# Patient Record
Sex: Female | Born: 1960
Health system: Southern US, Community
[De-identification: ages and names within clinical notes are randomized; demographics above are authoritative.]

## PROBLEM LIST (undated history)

## (undated) DIAGNOSIS — M1990 Unspecified osteoarthritis, unspecified site: Secondary | ICD-10-CM

## (undated) DIAGNOSIS — F419 Anxiety disorder, unspecified: Secondary | ICD-10-CM

## (undated) HISTORY — PX: COLON SURGERY: SHX602

## (undated) HISTORY — PX: BACK SURGERY: SHX140

## (undated) HISTORY — PX: OTHER SURGICAL HISTORY: SHX169

## (undated) HISTORY — PX: COLONOSCOPY: SHX174

## (undated) HISTORY — PX: ABDOMINAL HYSTERECTOMY: SHX81

---

## 2001-03-08 ENCOUNTER — Emergency Department (HOSPITAL_COMMUNITY): Admission: EM | Admit: 2001-03-08 | Discharge: 2001-03-08 | Payer: Self-pay | Admitting: Emergency Medicine

## 2006-09-23 ENCOUNTER — Ambulatory Visit: Payer: Self-pay | Admitting: Internal Medicine

## 2007-02-07 ENCOUNTER — Ambulatory Visit: Payer: Self-pay | Admitting: Endocrinology

## 2007-10-03 ENCOUNTER — Ambulatory Visit: Payer: Self-pay | Admitting: Internal Medicine

## 2007-10-04 ENCOUNTER — Ambulatory Visit: Payer: Self-pay | Admitting: Internal Medicine

## 2007-10-13 ENCOUNTER — Ambulatory Visit: Payer: Self-pay | Admitting: General Surgery

## 2014-03-19 ENCOUNTER — Ambulatory Visit (INDEPENDENT_AMBULATORY_CARE_PROVIDER_SITE_OTHER): Payer: BC Managed Care – PPO | Admitting: Podiatry

## 2014-03-19 ENCOUNTER — Ambulatory Visit (INDEPENDENT_AMBULATORY_CARE_PROVIDER_SITE_OTHER): Payer: BC Managed Care – PPO

## 2014-03-19 ENCOUNTER — Encounter: Payer: Self-pay | Admitting: Podiatry

## 2014-03-19 VITALS — BP 122/79 | HR 81 | Resp 16 | Ht 67.0 in | Wt 123.0 lb

## 2014-03-19 DIAGNOSIS — M79675 Pain in left toe(s): Secondary | ICD-10-CM

## 2014-03-19 DIAGNOSIS — M205X9 Other deformities of toe(s) (acquired), unspecified foot: Secondary | ICD-10-CM

## 2014-03-19 DIAGNOSIS — M205X2 Other deformities of toe(s) (acquired), left foot: Secondary | ICD-10-CM

## 2014-03-19 DIAGNOSIS — L84 Corns and callosities: Secondary | ICD-10-CM

## 2014-03-19 DIAGNOSIS — M79609 Pain in unspecified limb: Secondary | ICD-10-CM

## 2014-03-19 NOTE — Progress Notes (Signed)
   Subjective:    Patient ID: Sarah Clarke, female    DOB: 03-11-1961, 53 y.o.   MRN: [id number]  HPI Comments: 53 year old female presents the office today with complaints of a painful lesion on the inside aspect of her left fifth toe. She states the areas and painful for over one year and has been progressively getting worse. She states it is more painful with shoe gear. She states that she periodically removes the lesion herself. She also states that she went to see someone at [facility] in which they  Removed the lesion. No other complaints at this time.   Foot Pain      Review of Systems  HENT: Positive for sinus pressure.   Skin:       Change in nails  Allergic/Immunologic: Positive for environmental allergies.  All other systems reviewed and are negative.      Objective:   Physical Exam AAO x3, NAD DP/PT pulses palpable b/l. CRT < 3sec Protective sensation intact with Derrel Nip monofilament, vibratory sensation intact, Achilles tendon reflex intact. Left fifth digit medial aspect reveals hyperkeratotic lesion at approximately the head of the proximal phalanx region. Upon debridement there is no capillary bleeding and the underlying skin was intact. 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 MMT 5/5, ROM WNL No other lesions identified, no open lesions Her leg pain, swelling, warmth.      Assessment & Plan:  53 year old female with adductovarus left fifth digit with hyperkeratotic lesion due to pressure. -X-rays were obtained and reviewed with the patient. See x-ray report. Adductovarus fifth digit. No acute fracture. -Conservative versus surgical intervention were discussed with the patient in detail including alternatives, risks, complications. -Hyperkeratotic lesion sharply debrided without complications the patient comfort. -Stress the etiology and why lesion keeps  recurring. -Dispensed offloading pads to help protect the area. -Followup as needed. Call with any questions or concerns or any change in symptoms.

## 2014-06-18 ENCOUNTER — Other Ambulatory Visit: Payer: Self-pay | Admitting: Neurosurgery

## 2014-06-18 DIAGNOSIS — G959 Disease of spinal cord, unspecified: Secondary | ICD-10-CM

## 2014-06-19 ENCOUNTER — Ambulatory Visit
Admission: RE | Admit: 2014-06-19 | Discharge: 2014-06-19 | Disposition: A | Discharge status: Home / Self Care | Payer: BC Managed Care – PPO | Source: Ambulatory Visit | Attending: Neurosurgery | Admitting: Neurosurgery

## 2014-06-19 DIAGNOSIS — G959 Disease of spinal cord, unspecified: Secondary | ICD-10-CM

## 2014-12-28 ENCOUNTER — Inpatient Hospital Stay
Admission: EM | Admit: 2014-12-28 | Discharge: 2015-01-09 | DRG: 330 | Disposition: A | Discharge status: Home / Self Care | Payer: 59 | Attending: Surgery | Admitting: Surgery

## 2014-12-28 ENCOUNTER — Emergency Department: Payer: 59

## 2014-12-28 ENCOUNTER — Encounter: Payer: Self-pay | Admitting: Emergency Medicine

## 2014-12-28 DIAGNOSIS — R112 Nausea with vomiting, unspecified: Secondary | ICD-10-CM

## 2014-12-28 DIAGNOSIS — Z981 Arthrodesis status: Secondary | ICD-10-CM

## 2014-12-28 DIAGNOSIS — Z885 Allergy status to narcotic agent status: Secondary | ICD-10-CM

## 2014-12-28 DIAGNOSIS — Y836 Removal of other organ (partial) (total) as the cause of abnormal reaction of the patient, or of later complication, without mention of misadventure at the time of the procedure: Secondary | ICD-10-CM | POA: Diagnosis not present

## 2014-12-28 DIAGNOSIS — K562 Volvulus: Principal | ICD-10-CM | POA: Diagnosis present

## 2014-12-28 DIAGNOSIS — F4323 Adjustment disorder with mixed anxiety and depressed mood: Secondary | ICD-10-CM

## 2014-12-28 DIAGNOSIS — R1084 Generalized abdominal pain: Secondary | ICD-10-CM

## 2014-12-28 DIAGNOSIS — Y92239 Unspecified place in hospital as the place of occurrence of the external cause: Secondary | ICD-10-CM

## 2014-12-28 DIAGNOSIS — F419 Anxiety disorder, unspecified: Secondary | ICD-10-CM | POA: Diagnosis present

## 2014-12-28 DIAGNOSIS — K56609 Unspecified intestinal obstruction, unspecified as to partial versus complete obstruction: Secondary | ICD-10-CM

## 2014-12-28 DIAGNOSIS — Z888 Allergy status to other drugs, medicaments and biological substances status: Secondary | ICD-10-CM

## 2014-12-28 DIAGNOSIS — K913 Postprocedural intestinal obstruction: Secondary | ICD-10-CM | POA: Diagnosis not present

## 2014-12-28 DIAGNOSIS — M1990 Unspecified osteoarthritis, unspecified site: Secondary | ICD-10-CM | POA: Diagnosis present

## 2014-12-28 HISTORY — DX: Unspecified osteoarthritis, unspecified site: M19.90

## 2014-12-28 HISTORY — DX: Anxiety disorder, unspecified: F41.9

## 2014-12-28 LAB — CBC WITH DIFFERENTIAL/PLATELET
BASOS ABS: 0.1 10*3/uL (ref 0–0.1)
Basophils Relative: 1 %
Eosinophils Absolute: 0.2 10*3/uL (ref 0–0.7)
Eosinophils Relative: 2 %
HEMATOCRIT: 43.8 % (ref 35.0–47.0)
Hemoglobin: 14.8 g/dL (ref 12.0–16.0)
Lymphocytes Relative: 15 %
Lymphs Abs: 1.4 10*3/uL (ref 1.0–3.6)
MCH: 30.9 pg (ref 26.0–34.0)
MCHC: 33.8 g/dL (ref 32.0–36.0)
MCV: 91.2 fL (ref 80.0–100.0)
MONO ABS: 0.8 10*3/uL (ref 0.2–0.9)
Monocytes Relative: 8 %
NEUTROS ABS: 7 10*3/uL — AB (ref 1.4–6.5)
Neutrophils Relative %: 74 %
Platelets: 401 10*3/uL (ref 150–440)
RBC: 4.8 MIL/uL (ref 3.80–5.20)
RDW: 11.9 % (ref 11.5–14.5)
WBC: 9.4 10*3/uL (ref 3.6–11.0)

## 2014-12-28 LAB — COMPREHENSIVE METABOLIC PANEL
ALT: 14 U/L (ref 14–54)
ANION GAP: 10 (ref 5–15)
AST: 25 U/L (ref 15–41)
Albumin: 4.7 g/dL (ref 3.5–5.0)
Alkaline Phosphatase: 58 U/L (ref 38–126)
BUN: 7 mg/dL (ref 6–20)
CALCIUM: 9.6 mg/dL (ref 8.9–10.3)
CO2: 25 mmol/L (ref 22–32)
CREATININE: 0.7 mg/dL (ref 0.44–1.00)
Chloride: 93 mmol/L — ABNORMAL LOW (ref 101–111)
GFR calc Af Amer: >60 mL/min (ref >60)
GFR calc non Af Amer: >60 mL/min (ref >60)
GLUCOSE: 114 mg/dL — AB (ref 65–99)
Potassium: 3.5 mmol/L (ref 3.5–5.1)
Sodium: 128 mmol/L — ABNORMAL LOW (ref 135–145)
TOTAL PROTEIN: 8 g/dL (ref 6.5–8.1)
Total Bilirubin: 0.9 mg/dL (ref 0.3–1.2)

## 2014-12-28 LAB — ETHANOL: Alcohol, Ethyl (B): <5 mg/dL (ref <5)

## 2014-12-28 LAB — LIPASE, BLOOD: Lipase: 24 U/L (ref 22–51)

## 2014-12-28 MED ORDER — HYDROMORPHONE HCL 1 MG/ML IJ SOLN
INTRAMUSCULAR | Status: AC
  Administered 2014-12-28: 0.5 mg via INTRAVENOUS
  Filled 2014-12-28: qty 1

## 2014-12-28 MED ORDER — IOHEXOL 240 MG/ML SOLN
25.0000 mL | INTRAMUSCULAR | Status: AC
  Administered 2014-12-28: 25 mL via ORAL

## 2014-12-28 MED ORDER — ONDANSETRON HCL 4 MG/2ML IJ SOLN
4.0000 mg | Freq: Once | INTRAMUSCULAR | Status: AC
  Administered 2014-12-28: 4 mg via INTRAVENOUS

## 2014-12-28 MED ORDER — ONDANSETRON HCL 4 MG/2ML IJ SOLN
INTRAMUSCULAR | Status: AC
  Administered 2014-12-28: 4 mg via INTRAVENOUS
  Filled 2014-12-28: qty 2

## 2014-12-28 MED ORDER — HYDROMORPHONE HCL 1 MG/ML IJ SOLN
0.5000 mg | Freq: Once | INTRAMUSCULAR | Status: AC
  Administered 2014-12-28: 0.5 mg via INTRAVENOUS

## 2014-12-28 MED ORDER — SODIUM CHLORIDE 0.9 % IV SOLN
1000.0000 mL | Freq: Once | INTRAVENOUS | Status: AC
  Administered 2014-12-28: 1000 mL via INTRAVENOUS

## 2014-12-28 MED ORDER — FENTANYL CITRATE (PF) 100 MCG/2ML IJ SOLN
50.0000 ug | Freq: Once | INTRAMUSCULAR | Status: AC
  Administered 2014-12-28: 50 ug via INTRAVENOUS

## 2014-12-28 MED ORDER — SODIUM CHLORIDE 0.9 % IV BOLUS (SEPSIS)
1000.0000 mL | Freq: Once | INTRAVENOUS | Status: AC
  Administered 2014-12-28: 1000 mL via INTRAVENOUS

## 2014-12-28 MED ORDER — FENTANYL CITRATE (PF) 100 MCG/2ML IJ SOLN
INTRAMUSCULAR | Status: AC
  Administered 2014-12-28: 50 ug via INTRAVENOUS
  Filled 2014-12-28: qty 2

## 2014-12-28 NOTE — ED Notes (Signed)
Pt. Presents to the ED in wheelchair in pain of 10/10 to rt. Lower abdominal area radiating to the back.  Pt. States it pain started around 11am today.  Pt. States no hx of kidney stones.

## 2014-12-28 NOTE — ED Provider Notes (Signed)
[facility] Emergency Department Provider Note  ____________________________________________  Time seen: Approximately 11:11 PM  I have reviewed the triage vital signs and the nursing notes.   HISTORY  Chief Complaint Flank Pain    HPI Sarah Clarke is a 54 y.o. female who presents with diffuse abdominal pain, onset approximately 11 AM. Patient describes 10/10 lower abdominal pain radiating to bilateral flanks, right greater than left and now is experiencing pain diffusely. Patient has never experienced pain like this before. Patient describes alternating sharp and dull pain associated with several episodes of vomiting. Patient last had a normal bowel movement 2 yesterday. Nothing makes the pain better or worse.Patient denies fever, chills, chest pain, shortness of breath, diarrhea, dysuria, hematuria, headache, weakness, numbness, tingling. Patient denies prior history of colitis, diverticulitis, ovarian cysts or endometriosis.   Past Medical History  Diagnosis Date  . Arthritis   . Anxiety     There are no active problems to display for this patient.   Past Surgical History  Procedure Laterality Date  . Neck fusion    . Abdominal hysterectomy      Partial  Partial hysterectomy  Current Outpatient Rx  Name  Route  Sig  Dispense  Refill  . ALPRAZolam (XANAX) 0.25 MG tablet   Oral   Take 0.25 mg by mouth at bedtime as needed for anxiety.           Allergies Codeine and Morphine and related  Family history Kidney stones   Social History History  Substance Use Topics  . Smoking status: Never Smoker   . Smokeless tobacco: Not on file  . Alcohol Use: 14.4 oz/week    24 Cans of beer per week    Review of Systems Constitutional: No fever/chills Eyes: No visual changes. ENT: No sore throat. Cardiovascular: Denies chest pain. Respiratory: Denies shortness of breath. Gastrointestinal: Positive for abdominal pain.  Positive for nausea  and vomiting.  No diarrhea.  No constipation. Genitourinary: Negative for dysuria. Musculoskeletal: As above for flank pain. Skin: Negative for rash. Neurological: Negative for headaches, focal weakness or numbness.  10-point ROS otherwise negative.  ____________________________________________   PHYSICAL EXAM:  VITAL SIGNS: ED Triage Vitals  Enc Vitals Group     BP 12/28/14 2110 139/102 mmHg     Pulse Rate 12/28/14 2110 82     Resp --      Temp 12/28/14 2110 97.7 F (36.5 C)     Temp Source 12/28/14 2110 Oral     SpO2 12/28/14 2110 98 %     Weight 12/28/14 2110 118 lb (53.524 kg)     Height 12/28/14 2110 5\' 7"  (1.702 m)     Head Cir --      Peak Flow --      Pain Score 12/28/14 2111 10     Pain Loc --      Pain Edu? --      Excl. in [place]? --     Constitutional: Alert and oriented. Well appearing and in moderate acute distress. Eyes: Conjunctivae are normal. PERRL. EOMI. Head: Atraumatic. Nose: No congestion/rhinnorhea. Mouth/Throat: Mucous membranes are moist.  Oropharynx non-erythematous. Neck: No stridor.   Cardiovascular: Normal rate, regular rhythm. Grossly normal heart sounds.  Good peripheral circulation. Respiratory: Normal respiratory effort.  No retractions. Lungs CTAB. Gastrointestinal: Soft, diffusely tender with voluntary guarding, no rebound. No distention. No abdominal bruits. Mild right CVA tenderness. Musculoskeletal: No lower extremity tenderness nor edema.  No joint effusions. Neurologic:  Normal speech  and language. No gross focal neurologic deficits are appreciated. Speech is normal. No gait instability. Skin:  Skin is warm, dry and intact. No rash noted. Psychiatric: Mood and affect are normal. Speech and behavior are normal.  ____________________________________________   LABS (all labs ordered are listed, but only abnormal results are displayed)  Labs Reviewed  CBC WITH DIFFERENTIAL/PLATELET - Abnormal; Notable for the following:    Neutro  Abs 7.0 (*)    All other components within normal limits  COMPREHENSIVE METABOLIC PANEL - Abnormal; Notable for the following:    Sodium 128 (*)    Chloride 93 (*)    Glucose, Bld 114 (*)    All other components within normal limits  URINALYSIS COMPLETEWITH MICROSCOPIC ([facility] ONLY) - Abnormal; Notable for the following:    Color, Urine YELLOW (*)    APPearance HAZY (*)    Ketones, ur 1+ (*)    Squamous Epithelial / LPF 0-5 (*)    All other components within normal limits  ETHANOL  LIPASE, BLOOD   ____________________________________________  EKG  None ____________________________________________  RADIOLOGY  CT abdomen/pelvis discussed with Dr. Radene Knee: 1. Twisting of the mesentery of the ascending colon, with rotation of the ascending colon approximately 630 degrees around the mesentery, compatible with cecal volvulus. There appears to be effacement of the underlying vasculature. The cecum is flipped anteriorly, and dilated to 11.1 cm in diameter with air, and a small amount of fluid and solid material. 2. The more distal colon is decompressed. Scattered diverticulosis along the mid sigmoid colon, without evidence of diverticulitis. 3. Trace ascites about the liver and within the pelvis may reflect the cecal volvulus. 4. Minimal nonspecific prominence of the intrahepatic biliary ducts, likely within normal limits. ____________________________________________   PROCEDURES  Procedure(s) performed: None  Critical Care performed: No  ____________________________________________   INITIAL IMPRESSION / ASSESSMENT AND PLAN / ED COURSE  Pertinent labs & imaging results that were available during my care of the patient were reviewed by me and considered in my medical decision making (see chart for details).  54 year old female who presents with diffuse abdominal pain and vomiting. Labs noted for hyponatremia. Plan for IV fluid resuscitation, IV analgesia and antiemetic; CT  abdomen/pelvis to evaluate etiology of patient's pain.  ----------------------------------------- 1:05 AM on 12/29/2014 -----------------------------------------  Patient vomited first bottle of contrast. Antiemetic given. Patient working on additional PO contrast.   ----------------------------------------- 1:46 AM on 12/29/2014 -----------------------------------------  Patient vomited again. Will proceed to CT. IV Valium ordered for what patient describes as "muscle spasms".  ----------------------------------------- 2:52 AM on 12/29/2014 -----------------------------------------  Spoke with radiologist Dr. Radene Knee regarding CT results concerning for cecal volvulus. I have discussed the case with Dr. Rexene Edison from general surgery who will evaluate the patient in the emergency department. Patient's pain has increased and she is writhing around the bed in pain, restless and unable to remain still. Will give additional IV analgesia.  ____________________________________________   FINAL CLINICAL IMPRESSION(S) / ED DIAGNOSES  Final diagnoses:  Generalized abdominal pain  Non-intractable vomiting with nausea, vomiting of unspecified type  Cecal volvulus      Paulette Blanch, MD 12/29/14 [phone]

## 2014-12-29 ENCOUNTER — Emergency Department: Payer: 59 | Admitting: Certified Registered Nurse Anesthetist

## 2014-12-29 ENCOUNTER — Inpatient Hospital Stay: Admit: 2014-12-29 | Payer: Self-pay | Admitting: Surgery

## 2014-12-29 ENCOUNTER — Encounter: Payer: Self-pay | Admitting: Emergency Medicine

## 2014-12-29 ENCOUNTER — Encounter
Admission: EM | Disposition: A | Discharge status: Home / Self Care | Payer: Self-pay | Source: Home / Self Care | Attending: Surgery

## 2014-12-29 ENCOUNTER — Emergency Department: Payer: 59

## 2014-12-29 DIAGNOSIS — R1084 Generalized abdominal pain: Secondary | ICD-10-CM | POA: Diagnosis present

## 2014-12-29 DIAGNOSIS — Y92239 Unspecified place in hospital as the place of occurrence of the external cause: Secondary | ICD-10-CM | POA: Diagnosis not present

## 2014-12-29 DIAGNOSIS — M1990 Unspecified osteoarthritis, unspecified site: Secondary | ICD-10-CM | POA: Diagnosis present

## 2014-12-29 DIAGNOSIS — F4323 Adjustment disorder with mixed anxiety and depressed mood: Secondary | ICD-10-CM | POA: Diagnosis not present

## 2014-12-29 DIAGNOSIS — Z981 Arthrodesis status: Secondary | ICD-10-CM | POA: Diagnosis not present

## 2014-12-29 DIAGNOSIS — Y836 Removal of other organ (partial) (total) as the cause of abnormal reaction of the patient, or of later complication, without mention of misadventure at the time of the procedure: Secondary | ICD-10-CM | POA: Diagnosis not present

## 2014-12-29 DIAGNOSIS — K562 Volvulus: Secondary | ICD-10-CM | POA: Diagnosis present

## 2014-12-29 DIAGNOSIS — Z888 Allergy status to other drugs, medicaments and biological substances status: Secondary | ICD-10-CM | POA: Diagnosis not present

## 2014-12-29 DIAGNOSIS — K913 Postprocedural intestinal obstruction: Secondary | ICD-10-CM | POA: Diagnosis not present

## 2014-12-29 DIAGNOSIS — F419 Anxiety disorder, unspecified: Secondary | ICD-10-CM | POA: Diagnosis present

## 2014-12-29 DIAGNOSIS — Z885 Allergy status to narcotic agent status: Secondary | ICD-10-CM | POA: Diagnosis not present

## 2014-12-29 HISTORY — PX: LAPAROTOMY: SHX154

## 2014-12-29 LAB — URINALYSIS COMPLETE WITH MICROSCOPIC (ARMC ONLY)
Bacteria, UA: NONE SEEN
Bilirubin Urine: NEGATIVE
Glucose, UA: NEGATIVE mg/dL
Hgb urine dipstick: NEGATIVE
Leukocytes, UA: NEGATIVE
Nitrite: NEGATIVE
Protein, ur: NEGATIVE mg/dL
Specific Gravity, Urine: 1.009 (ref 1.005–1.030)
pH: 8 (ref 5.0–8.0)

## 2014-12-29 LAB — GLUCOSE, CAPILLARY: GLUCOSE-CAPILLARY: 104 mg/dL — AB (ref 65–99)

## 2014-12-29 SURGERY — LAPAROTOMY, EXPLORATORY
Anesthesia: General | Wound class: Contaminated

## 2014-12-29 MED ORDER — ONDANSETRON HCL 4 MG/2ML IJ SOLN
4.0000 mg | Freq: Four times a day (QID) | INTRAMUSCULAR | Status: DC | PRN
  Administered 2015-01-02: 4 mg via INTRAVENOUS
  Filled 2014-12-29: qty 2

## 2014-12-29 MED ORDER — DIPHENHYDRAMINE HCL 12.5 MG/5ML PO ELIX
12.5000 mg | ORAL_SOLUTION | Freq: Four times a day (QID) | ORAL | Status: DC | PRN
  Filled 2014-12-29: qty 5

## 2014-12-29 MED ORDER — DEXAMETHASONE SODIUM PHOSPHATE 4 MG/ML IJ SOLN
INTRAMUSCULAR | Status: DC | PRN
  Administered 2014-12-29: 5 mg via INTRAVENOUS

## 2014-12-29 MED ORDER — DIAZEPAM 5 MG/ML IJ SOLN
INTRAMUSCULAR | Status: AC
  Administered 2014-12-29: 1 mg via INTRAVENOUS
  Filled 2014-12-29: qty 2

## 2014-12-29 MED ORDER — ACETAMINOPHEN 325 MG PO TABS: 650.0000 mg | ORAL_TABLET | Freq: Four times a day (QID) | ORAL | Status: DC | PRN

## 2014-12-29 MED ORDER — PROMETHAZINE HCL 25 MG/ML IJ SOLN: 12.5000 mg | INTRAMUSCULAR | Status: DC | PRN

## 2014-12-29 MED ORDER — FENTANYL CITRATE (PF) 100 MCG/2ML IJ SOLN
INTRAMUSCULAR | Status: AC
  Filled 2014-12-29: qty 2

## 2014-12-29 MED ORDER — PROMETHAZINE HCL 25 MG/ML IJ SOLN
12.5000 mg | Freq: Once | INTRAMUSCULAR | Status: AC
  Administered 2014-12-29: 12.5 mg via INTRAVENOUS

## 2014-12-29 MED ORDER — DIAZEPAM 5 MG/ML IJ SOLN
1.0000 mg | Freq: Once | INTRAMUSCULAR | Status: AC
  Administered 2014-12-29: 1 mg via INTRAVENOUS

## 2014-12-29 MED ORDER — HYDROMORPHONE HCL 1 MG/ML IJ SOLN
INTRAMUSCULAR | Status: AC
  Filled 2014-12-29: qty 1

## 2014-12-29 MED ORDER — SODIUM CHLORIDE 0.9 % IV BOLUS (SEPSIS)
1000.0000 mL | Freq: Once | INTRAVENOUS | Status: AC
  Administered 2014-12-29: 1000 mL via INTRAVENOUS

## 2014-12-29 MED ORDER — ACETAMINOPHEN 650 MG RE SUPP
650.0000 mg | Freq: Four times a day (QID) | RECTAL | Status: DC | PRN
  Filled 2014-12-29: qty 1

## 2014-12-29 MED ORDER — LIDOCAINE HCL (CARDIAC) 20 MG/ML IV SOLN
INTRAVENOUS | Status: DC | PRN
  Administered 2014-12-29: 50 mg via INTRAVENOUS

## 2014-12-29 MED ORDER — MIDAZOLAM HCL 5 MG/5ML IJ SOLN
INTRAMUSCULAR | Status: AC
  Filled 2014-12-29: qty 5

## 2014-12-29 MED ORDER — HYDROMORPHONE 0.3 MG/ML IV SOLN
INTRAVENOUS | Status: DC
  Administered 2014-12-29: 2.1 mg via INTRAVENOUS
  Administered 2014-12-29: 0.9 mg via INTRAVENOUS
  Administered 2014-12-29: 5 mg via INTRAVENOUS
  Administered 2014-12-29: 0.3 mg via INTRAVENOUS
  Administered 2014-12-30: 0.9 mg via INTRAVENOUS
  Administered 2014-12-30: 20:00:00 via INTRAVENOUS
  Administered 2014-12-31: 0.599 mg via INTRAVENOUS
  Administered 2014-12-31: 1.19 mg via INTRAVENOUS
  Administered 2014-12-31 (×2): 2.67 mg via INTRAVENOUS
  Administered 2014-12-31: 0.2 mg via INTRAVENOUS
  Administered 2015-01-01: 0.447 mL via INTRAVENOUS
  Administered 2015-01-01: 0.39 mg via INTRAVENOUS
  Administered 2015-01-01: 2.19 mL via INTRAVENOUS
  Administered 2015-01-01: 0.999 mg via INTRAVENOUS
  Administered 2015-01-01: 0 mg via INTRAVENOUS
  Administered 2015-01-01: 0.999 mg via INTRAVENOUS
  Administered 2015-01-01: 2.19 mL via INTRAVENOUS
  Administered 2015-01-02: 0.59 mg via INTRAVENOUS
  Administered 2015-01-02: 16:00:00 via INTRAVENOUS
  Administered 2015-01-02: 2.39 mg via INTRAVENOUS
  Administered 2015-01-03: 0.599 mg via INTRAVENOUS
  Administered 2015-01-03: 1.19 mg via INTRAVENOUS
  Administered 2015-01-03: 0.599 mg via INTRAVENOUS
  Administered 2015-01-03: 21:00:00 via INTRAVENOUS
  Administered 2015-01-03: 0.799 mg via INTRAVENOUS
  Administered 2015-01-03: 0.599 mg via INTRAVENOUS
  Administered 2015-01-03: 0.4 mg via INTRAVENOUS
  Administered 2015-01-04: 1.59 mg via INTRAVENOUS
  Administered 2015-01-04: 0 mg via INTRAVENOUS
  Administered 2015-01-04: 0.4 mg via INTRAVENOUS
  Administered 2015-01-04: 4 mg via INTRAVENOUS
  Administered 2015-01-05: 1.19 mg via INTRAVENOUS
  Administered 2015-01-05: 0 mg via INTRAVENOUS
  Administered 2015-01-05: 03:00:00 via INTRAVENOUS
  Filled 2014-12-29 (×7): qty 25

## 2014-12-29 MED ORDER — HYDROMORPHONE HCL 1 MG/ML IJ SOLN
INTRAMUSCULAR | Status: AC
  Administered 2014-12-29: 0.5 mg via INTRAVENOUS
  Filled 2014-12-29: qty 1

## 2014-12-29 MED ORDER — IOHEXOL 300 MG/ML  SOLN
100.0000 mL | Freq: Once | INTRAMUSCULAR | Status: AC | PRN
  Administered 2014-12-29: 100 mL via INTRAVENOUS

## 2014-12-29 MED ORDER — ALPRAZOLAM 0.25 MG PO TABS
0.2500 mg | ORAL_TABLET | Freq: Every evening | ORAL | Status: DC | PRN
  Administered 2014-12-31 – 2015-01-01 (×2): 0.25 mg via ORAL
  Filled 2014-12-29 (×2): qty 1

## 2014-12-29 MED ORDER — HEPARIN SODIUM (PORCINE) 5000 UNIT/ML IJ SOLN
5000.0000 [IU] | Freq: Three times a day (TID) | INTRAMUSCULAR | Status: DC
  Administered 2014-12-29 – 2015-01-09 (×32): 5000 [IU] via SUBCUTANEOUS
  Filled 2014-12-29 (×31): qty 1

## 2014-12-29 MED ORDER — SODIUM CHLORIDE 0.9 % IV SOLN
1.0000 g | Freq: Once | INTRAVENOUS | Status: AC
  Administered 2014-12-29: 1 g via INTRAVENOUS
  Filled 2014-12-29: qty 1

## 2014-12-29 MED ORDER — DIAZEPAM 5 MG/ML IJ SOLN: 1.0000 mg | Freq: Once | INTRAMUSCULAR | Status: DC

## 2014-12-29 MED ORDER — LACTATED RINGERS IV SOLN
INTRAVENOUS | Status: DC
  Administered 2014-12-29: 1000 mL via INTRAVENOUS
  Administered 2014-12-29 – 2015-01-05 (×9): via INTRAVENOUS

## 2014-12-29 MED ORDER — PROMETHAZINE HCL 25 MG/ML IJ SOLN
INTRAMUSCULAR | Status: AC
  Administered 2014-12-29: 12.5 mg via INTRAVENOUS
  Filled 2014-12-29: qty 1

## 2014-12-29 MED ORDER — ROCURONIUM BROMIDE 100 MG/10ML IV SOLN
INTRAVENOUS | Status: DC | PRN
  Administered 2014-12-29: 30 mg via INTRAVENOUS

## 2014-12-29 MED ORDER — FENTANYL CITRATE (PF) 100 MCG/2ML IJ SOLN
25.0000 ug | INTRAMUSCULAR | Status: AC | PRN
  Administered 2014-12-29 (×6): 25 ug via INTRAVENOUS

## 2014-12-29 MED ORDER — FENTANYL CITRATE (PF) 100 MCG/2ML IJ SOLN
INTRAMUSCULAR | Status: DC | PRN
  Administered 2014-12-29: 50 ug via INTRAVENOUS
  Administered 2014-12-29: 100 ug via INTRAVENOUS

## 2014-12-29 MED ORDER — HYDROMORPHONE HCL 1 MG/ML IJ SOLN
0.5000 mg | Freq: Once | INTRAMUSCULAR | Status: AC
  Administered 2014-12-29: 0.5 mg via INTRAVENOUS

## 2014-12-29 MED ORDER — NALOXONE HCL 0.4 MG/ML IJ SOLN
0.4000 mg | INTRAMUSCULAR | Status: DC | PRN
Start: 2014-12-29 — End: 2015-01-05
  Filled 2014-12-29: qty 1

## 2014-12-29 MED ORDER — SODIUM CHLORIDE 0.9 % IJ SOLN: 9.0000 mL | INTRAMUSCULAR | Status: DC | PRN

## 2014-12-29 MED ORDER — LACTATED RINGERS IV SOLN
INTRAVENOUS | Status: DC | PRN
  Administered 2014-12-29 (×2): via INTRAVENOUS

## 2014-12-29 MED ORDER — PANTOPRAZOLE SODIUM 40 MG IV SOLR
40.0000 mg | Freq: Every day | INTRAVENOUS | Status: DC
  Administered 2014-12-29 – 2015-01-04 (×7): 40 mg via INTRAVENOUS
  Filled 2014-12-29 (×8): qty 40

## 2014-12-29 MED ORDER — MIDAZOLAM HCL 2 MG/2ML IJ SOLN
2.0000 mg | Freq: Once | INTRAMUSCULAR | Status: AC
  Administered 2014-12-29: 2 mg via INTRAVENOUS

## 2014-12-29 MED ORDER — MIDAZOLAM HCL 2 MG/2ML IJ SOLN
INTRAMUSCULAR | Status: DC | PRN
  Administered 2014-12-29: 2 mg via INTRAVENOUS

## 2014-12-29 MED ORDER — SUCCINYLCHOLINE CHLORIDE 20 MG/ML IJ SOLN
INTRAMUSCULAR | Status: DC | PRN
  Administered 2014-12-29: 100 mg via INTRAVENOUS

## 2014-12-29 MED ORDER — 0.9 % SODIUM CHLORIDE (POUR BTL) OPTIME
TOPICAL | Status: DC | PRN
  Administered 2014-12-29: 1200 mL

## 2014-12-29 MED ORDER — ONDANSETRON HCL 4 MG/2ML IJ SOLN: 4.0000 mg | Freq: Once | INTRAMUSCULAR | Status: DC | PRN

## 2014-12-29 MED ORDER — ONDANSETRON HCL 4 MG/2ML IJ SOLN
4.0000 mg | Freq: Four times a day (QID) | INTRAMUSCULAR | Status: DC | PRN
  Administered 2015-01-02 (×2): 4 mg via INTRAVENOUS
  Filled 2014-12-29 (×2): qty 2

## 2014-12-29 MED ORDER — DIPHENHYDRAMINE HCL 50 MG/ML IJ SOLN: 12.5000 mg | Freq: Four times a day (QID) | INTRAMUSCULAR | Status: DC | PRN

## 2014-12-29 MED ORDER — ONDANSETRON HCL 4 MG/2ML IJ SOLN
INTRAMUSCULAR | Status: DC | PRN
  Administered 2014-12-29: 4 mg via INTRAVENOUS

## 2014-12-29 SURGICAL SUPPLY — 29 items
CANISTER SUCT 1200ML W/VALVE (MISCELLANEOUS) ×2 IMPLANT
CATH TRAY 16F METER LATEX (MISCELLANEOUS) ×2 IMPLANT
CHLORAPREP W/TINT 26ML (MISCELLANEOUS) ×2 IMPLANT
DRAPE LAPAROTOMY 100X77 ABD (DRAPES) ×2 IMPLANT
DRSG OPSITE POSTOP 4X10 (GAUZE/BANDAGES/DRESSINGS) ×2 IMPLANT
ELECT CAUTERY BLADE 6.4 (BLADE) ×2 IMPLANT
GAUZE SPONGE 4X4 12PLY STRL (GAUZE/BANDAGES/DRESSINGS) IMPLANT
GLOVE BIO SURGEON STRL SZ7.5 (GLOVE) ×4 IMPLANT
GLOVE INDICATOR 8.0 STRL GRN (GLOVE) ×4 IMPLANT
GOWN STRL REUS W/ TWL LRG LVL3 (GOWN DISPOSABLE) ×2 IMPLANT
GOWN STRL REUS W/TWL LRG LVL3 (GOWN DISPOSABLE) ×4
KIT RM TURNOVER STRD PROC AR (KITS) ×2 IMPLANT
LABEL OR SOLS (LABEL) ×2 IMPLANT
NS IRRIG 1000ML POUR BTL (IV SOLUTION) ×4 IMPLANT
PACK BASIN MAJOR ARMC (MISCELLANEOUS) ×2 IMPLANT
PAD GROUND ADULT SPLIT (MISCELLANEOUS) ×2 IMPLANT
RELOAD PROXIMATE 75MM BLUE (ENDOMECHANICALS) ×4 IMPLANT
SHEARS HARMONIC STRL 23CM (MISCELLANEOUS) ×2 IMPLANT
STAPLER GUN LINEAR PROX 60 (STAPLE) ×2 IMPLANT
STAPLER PROXIMATE 75MM BLUE (STAPLE) ×2 IMPLANT
STAPLER SKIN PROX 35W (STAPLE) ×2 IMPLANT
SUT PDS AB 1 TP1 96 (SUTURE) ×4 IMPLANT
SUT SILK 3 0 (SUTURE) ×2
SUT SILK 3-0 (SUTURE) ×2
SUT SILK 3-0 18XBRD TIE 12 (SUTURE) ×1 IMPLANT
SUT SILK 3-0 SH-1 18XCR BRD (SUTURE) ×1
SUT VIC AB 3-0 SH 27 (SUTURE) ×2
SUT VIC AB 3-0 SH 27X BRD (SUTURE) ×1 IMPLANT
SUTURE SILK 3-0 SH-1 18XCR BRD (SUTURE) ×1 IMPLANT

## 2014-12-29 NOTE — Anesthesia Postprocedure Evaluation (Addendum)
  Anesthesia Post-op Note  Patient: Sarah Clarke  Procedure(s) Performed: Procedure(s): EXPLORATORY LAPAROTOMY with right hemicolectomy  (N/A)  Anesthesia type:General  Patient location: PACU  Post pain: Pain level controlled  Post assessment: Post-op Vital signs reviewed, Patient's Cardiovascular Status Stable, Respiratory Function Stable, Patent Airway and No signs of Nausea or vomiting  Post vital signs: Reviewed and stable  Last Vitals:  Filed Vitals:   12/29/14 0737  BP: 135/81  Pulse: 85  Temp: 36.5 C  Resp: 17    Level of consciousness: awake, alert  and patient cooperative  Complications: No apparent anesthesia complications Pt with no anesthesia complaints.  Surgeons ordering PCA.  Pt c/o abd discomfort

## 2014-12-29 NOTE — Progress Notes (Signed)
Day of Surgery   Subjective:  Slept well. Pain control. Speech minimally slurred.  Vital signs in last 24 hours: Temp:  [96.9 F (36.1 C)-97.8 F (36.6 C)] 97.7 F (36.5 C) (06/12 0737) Pulse Rate:  [59-105] 85 (06/12 0737) Resp:  [8-26] 15 (06/12 0751) BP: (109-183)/(69-106) 135/81 mmHg (06/12 0737) SpO2:  [79 %-100 %] 100 % (06/12 0751) FiO2 (%):  [100 %] 100 % (06/12 0751) Weight:  [53.524 kg (118 lb)] 53.524 kg (118 lb) (06/11 2110)    Intake/Output from previous day: 06/11 0701 - 06/12 0700 In: 1400 [I.V.:1400] Out: 975 [Urine:925; Blood:50]  GI: soft, non-tender; bowel sounds normal; no masses,  no organomegaly  Lab Results:  CBC  Recent Labs  12/28/14 2150  WBC 9.4  HGB 14.8  HCT 43.8  PLT 401   CMP     Component Value Date/Time   NA 128* 12/28/2014 2150   K 3.5 12/28/2014 2150   CL 93* 12/28/2014 2150   CO2 25 12/28/2014 2150   GLUCOSE 114* 12/28/2014 2150   BUN 7 12/28/2014 2150   CREATININE 0.70 12/28/2014 2150   CALCIUM 9.6 12/28/2014 2150   PROT 8.0 12/28/2014 2150   ALBUMIN 4.7 12/28/2014 2150   AST 25 12/28/2014 2150   ALT 14 12/28/2014 2150   ALKPHOS 58 12/28/2014 2150   BILITOT 0.9 12/28/2014 2150   GFRNONAA >60 12/28/2014 2150   GFRAA >60 12/28/2014 2150   PT/INR No results for input(s): LABPROT, INR in the last 72 hours.  Studies/Results: Ct Abdomen Pelvis W Contrast  12/29/2014   CLINICAL DATA:  Acute onset of right-sided abdominal and lower back pain, nausea and vomiting. Initial encounter.  EXAM: CT ABDOMEN AND PELVIS WITH CONTRAST  TECHNIQUE: Multidetector CT imaging of the abdomen and pelvis was performed using the standard protocol following bolus administration of intravenous contrast.  CONTRAST:  113mL OMNIPAQUE IOHEXOL 300 MG/ML  SOLN  COMPARISON:  Lumbar spine radiographs performed 05/29/2014  FINDINGS: The visualized lung bases are clear.  Trace ascites is noted about the liver. There is minimal prominence of the intrahepatic  biliary ducts. The gallbladder is unremarkable in appearance. The common bile duct remains grossly normal in caliber.  The liver and spleen are otherwise unremarkable in appearance. The gallbladder is within normal limits. The pancreas and adrenal glands are unremarkable.  Mild bilateral renal pelvicaliectasis remains within normal limits. There is no evidence of hydronephrosis. No renal or ureteral stones are seen. No significant perinephric stranding is appreciated.  The small bowel is unremarkable in appearance. The stomach is within normal limits. No acute vascular abnormalities are seen.  There appears to be twisting of the mesentery at the ascending colon, with rotation of the ascending colon approximately 630 degrees around the mesentery, compatible with cecal volvulus. There appears to be effacement of the underlying vasculature. The cecum is flipped anteriorly, and dilated to 11.1 cm in diameter. The cecum is largely filled with air, and a small amount of fluid and solid material.  The more distal colon is decompressed and somewhat redundant. Scattered diverticulosis is noted along the mid sigmoid colon, without evidence of diverticulitis.  The appendix is not well characterized; there is no evidence of appendicitis.  The bladder is moderately distended and grossly unremarkable. The patient is status post hysterectomy. The ovaries are relatively symmetric. Trace fluid within the pelvis may reflect the cecal volvulus. No inguinal lymphadenopathy is seen.  No acute osseous abnormalities are identified. Endplate sclerotic change is noted at the inferior endplate  of L3.  IMPRESSION: 1. Twisting of the mesentery of the ascending colon, with rotation of the ascending colon approximately 630 degrees around the mesentery, compatible with cecal volvulus. There appears to be effacement of the underlying vasculature. The cecum is flipped anteriorly, and dilated to 11.1 cm in diameter with air, and a small amount of  fluid and solid material. 2. The more distal colon is decompressed. Scattered diverticulosis along the mid sigmoid colon, without evidence of diverticulitis. 3. Trace ascites about the liver and within the pelvis may reflect the cecal volvulus. 4. Minimal nonspecific prominence of the intrahepatic biliary ducts, likely within normal limits.  These results were called by telephone at the time of interpretation on 12/29/2014 at 2:50 am to Dr. Lurline Hare, who verbally acknowledged these results.   Electronically Signed   By: Garald Balding M.D.   On: 12/29/2014 02:52   Dg Chest Port 1 View  12/29/2014   CLINICAL DATA:  Acute onset of lower abdominal pain, radiating to the back, with nausea. Initial encounter.  EXAM: PORTABLE CHEST - 1 VIEW  COMPARISON:  None.  FINDINGS: The lungs are well-aerated and clear. There is no evidence of focal opacification, pleural effusion or pneumothorax.  The cardiomediastinal silhouette is within normal limits. No acute osseous abnormalities are seen. Cervical spinal fusion hardware is noted.  IMPRESSION: No acute cardiopulmonary process seen.   Electronically Signed   By: Garald Balding M.D.   On: 12/29/2014 00:28    Assessment/Plan: Expected course thus far. I explained the pathology and the operation to the patient and her husband. No changes to current management.

## 2014-12-29 NOTE — Transfer of Care (Signed)
Immediate Anesthesia Transfer of Care Note  Patient: Sarah Clarke  Procedure(s) Performed: Procedure(s): EXPLORATORY LAPAROTOMY with right hemicolectomy  (N/A)  Patient Location: PACU  Anesthesia Type:General  Level of Consciousness: Alert, Awake, Oriented  Airway & Oxygen Therapy: Patient Spontanous Breathing  Post-op Assessment: Report given to RN  Post vital signs: Reviewed and stable  Last Vitals:  Filed Vitals:   12/29/14 0534  BP: 119/106  Pulse: 105  Temp: 36.1 C  Resp: 17    Complications: No apparent anesthesia complications

## 2014-12-29 NOTE — Anesthesia Preprocedure Evaluation (Signed)
Anesthesia Evaluation  Patient identified by MRN, date of birth, ID band Patient awake    Reviewed: Allergy & Precautions, NPO status , Patient's Chart, lab work & pertinent test results  Airway Mallampati: III  TM Distance: >3 FB     Dental  (+) Chipped   Pulmonary          Cardiovascular     Neuro/Psych Anxiety    GI/Hepatic (+)     substance abuse  alcohol use,   Endo/Other    Renal/GU      Musculoskeletal  (+) Arthritis -,   Abdominal   Peds  Hematology   Anesthesia Other Findings She has had a cervical fusion ROM of neck slightly reduced.  Reproductive/Obstetrics                             Anesthesia Physical Anesthesia Plan  ASA: II  Anesthesia Plan: General   Post-op Pain Management:    Induction: Intravenous  Airway Management Planned: Oral ETT  Additional Equipment:   Intra-op Plan:   Post-operative Plan:   Informed Consent: I have reviewed the patients History and Physical, chart, labs and discussed the procedure including the risks, benefits and alternatives for the proposed anesthesia with the patient or authorized representative who has indicated his/her understanding and acceptance.     Plan Discussed with: CRNA  Anesthesia Plan Comments:         Anesthesia Quick Evaluation

## 2014-12-29 NOTE — Care Management Note (Signed)
Case Management Note  Patient Details  Name: Sarah Clarke MRN: [id number] Date of Birth: 1960/09/08  Subjective/Objective:    Today is date of surgery. Case Management will follow for discharge planning.                   Expected Discharge Date:                  Expected Discharge Plan:     In-House Referral:     Discharge planning Services     Post Acute Care Choice:    Choice offered to:     DME Arranged:    DME Agency:     HH Arranged:    [place] Agency:     Status of Service:     Medicare Important Message Given:    Date Medicare IM Given:    Medicare IM give by:    Date Additional Medicare IM Given:    Additional Medicare Important Message give by:     If discussed at [place] of Stay Meetings, dates discussed:    Additional Comments:  Clora Ohmer A, RN 12/29/2014, 9:12 AM

## 2014-12-29 NOTE — ED Notes (Signed)
Patient transported to CT 

## 2014-12-29 NOTE — H&P (Signed)
CC: Abdominal pain, nausea/vomiting x 1 day  HPI: 54 yo F who presents with 1 day of worsening now severe pain, nausea and vomiting. Began acutely, worsening.  Diffuse.  Writhing in pain.  No fevers/chills, night sweats, shortness of breath, cough, chest pain, diarrhea/constipation, dysuria/hematuria.  Active Ambulatory Problems    Diagnosis Date Noted  . No Active Ambulatory Problems   Resolved Ambulatory Problems    Diagnosis Date Noted  . No Resolved Ambulatory Problems   Past Medical History  Diagnosis Date  . Arthritis   . Anxiety    History reviewed. No pertinent family history.   History   Social History  . Marital Status: Divorced    Spouse Name: N/A  . Number of Children: N/A  . Years of Education: N/A   Occupational History  . Not on file.   Social History Main Topics  . Smoking status: Never Smoker   . Smokeless tobacco: Not on file  . Alcohol Use: 14.4 oz/week    24 Cans of beer per week  . Drug Use: Not on file  . Sexual Activity: Not Currently   Other Topics Concern  . Not on file   Social History Narrative   Allergies  Allergen Reactions  . Codeine Itching  . Morphine And Related Other (See Comments)    hallucinations   Meds: Xanax  ROS: Full ROS obtained, pertinent positives and negatives as above  Blood pressure 172/94, pulse 63, temperature 97.7 F (36.5 C), temperature source Oral, height 5\' 7"  (1.702 m), weight 53.524 kg (118 lb), SpO2 100 %. GEN: In acute distress/A&Ox3 FACE: no obvious facial trauma, normal external nose, normal external ears EYES: no scleral icterus, no conjunctivitis HEAD: normocephalic atraumatic CV: RRR, no MRG RESP: moving air well, lungs clear ABD: soft, extremely tender, nondistended EXT: moving all ext well, strength 5/5 NEURO: cnII-XII grossly intact, sensation intact all 4 ext  Labs: personally reviewed, pertinent abnormalities include  Na 128, Cl 93 WBC 9.4  CT: personally reviewed, significantly  dilated cecum with mesenteric swirling (radiology read is cecal volvulus 630degrees rotated)  A/P54 yo F with cecal volvulus.  Plan on Ex lap with right hemicolectomy.  Patient explained procedure, risks and benefits and she agrees to proceed.

## 2014-12-29 NOTE — Anesthesia Procedure Notes (Signed)
Procedure Name: Intubation Date/Time: 12/29/2014 4:09 AM Performed by: Eliberto Ivory Pre-anesthesia Checklist: Patient identified, Patient being monitored, Timeout performed, Emergency Drugs available and Suction available Patient Re-evaluated:Patient Re-evaluated prior to inductionOxygen Delivery Method: Circle system utilized Preoxygenation: Pre-oxygenation with 100% oxygen Intubation Type: IV induction Ventilation: Mask ventilation without difficulty Laryngoscope Size: Mac and 3 Grade View: Grade I Tube type: Oral Tube size: 7.0 mm Number of attempts: 1 Placement Confirmation: ETT inserted through vocal cords under direct vision,  positive ETCO2 and breath sounds checked- equal and bilateral Secured at: 21 cm Tube secured with: Tape Dental Injury: Teeth and Oropharynx as per pre-operative assessment

## 2014-12-29 NOTE — Op Note (Signed)
Preoperative diagnosis: Cecal Volvulus Postoperative diagnosis: Cecal Volvulus Procedure performed: Exploratory laparotomy, right hemicolectomy  Surgeon: Braya Habermehl  Anesthesia: General Endotracheal EBL: 50 ml Specimen: Right colon  Indication for surgery: Sarah Clarke is a pleasant 54 yo F who presented with a cecal volvulus.  She was brought to the OR for surgical management of cecal volvulus  Details of procedure: Informed consent was obtained.  Sarah Clarke was brought to the operating room suite.  He was induced, ETT placed, general anesthesia administered.    Abdomen prepped and draped in the standard surgical fashion and a timeout was performed correctly identifying patient name, operative site and procedure to be performed.  A midline incision was made and deepened to the fascia.  The fascia was incised and the peritoneum was entered.  Immediately the distended and dusky right colon was encountered.  It was significantly volvulized.  The colon was then untwisted and the hepatic flexure was taken down.  The colon was transected with GIA-75 staplers at the terminal ileum and just distal to the volvulus to the right of the middle colic vessels.  The colonic mesentery was divided using a combination of harmonic scalpel and suture ligature.  A stapled side-to-side functional end-to-end anastamosis was then performed.  Two stay sutures were placed to place the TI and transverse colon side by side then a colostomy and ileostomy were created at the staple lines.  A single fire of a GIA-75 was used to make a common channel.  A TX-60 was used to close the coloenterostomy.  A 3-0 silk crotch stitch was placed and the mesenteric defect was closed with a running 3-0 vicryl suture.  The bowel was then returned to the abdomen after I ensured that it was not twisted.  The abdomen was then irrigated with warm normal saline.  The incision was then closed from both ends with two running looped #1 PDS sutures which were  then tied in the middle.  The skin was then closed with staples and a sterile dressing was placed.  The patient was then awoken and brought to the post anesthesia care unit.  There were no immediate complications.  The needle, sponge and instrument count was correct at the end of the procedure.

## 2014-12-30 ENCOUNTER — Encounter: Payer: Self-pay | Admitting: Surgery

## 2014-12-30 LAB — BASIC METABOLIC PANEL
Anion gap: 6 (ref 5–15)
BUN: 10 mg/dL (ref 6–20)
CALCIUM: 8 mg/dL — AB (ref 8.9–10.3)
CO2: 28 mmol/L (ref 22–32)
Chloride: 101 mmol/L (ref 101–111)
Creatinine, Ser: 0.72 mg/dL (ref 0.44–1.00)
GLUCOSE: 94 mg/dL (ref 65–99)
Potassium: 3.7 mmol/L (ref 3.5–5.1)
Sodium: 135 mmol/L (ref 135–145)

## 2014-12-30 LAB — CBC
HEMATOCRIT: 29.4 % — AB (ref 35.0–47.0)
Hemoglobin: 9.9 g/dL — ABNORMAL LOW (ref 12.0–16.0)
MCH: 31.8 pg (ref 26.0–34.0)
MCHC: 33.9 g/dL (ref 32.0–36.0)
MCV: 93.8 fL (ref 80.0–100.0)
Platelets: 285 10*3/uL (ref 150–440)
RBC: 3.14 MIL/uL — ABNORMAL LOW (ref 3.80–5.20)
RDW: 11.8 % (ref 11.5–14.5)
WBC: 11.6 10*3/uL — ABNORMAL HIGH (ref 3.6–11.0)

## 2014-12-30 MED ORDER — DIAZEPAM 5 MG/ML IJ SOLN
2.5000 mg | INTRAMUSCULAR | Status: DC | PRN
  Administered 2014-12-31 – 2015-01-01 (×2): 2.5 mg via INTRAVENOUS
  Filled 2014-12-30 (×2): qty 2

## 2014-12-30 MED ORDER — DIPHENHYDRAMINE HCL 50 MG/ML IJ SOLN: 25.0000 mg | Freq: Every evening | INTRAMUSCULAR | Status: DC | PRN

## 2014-12-30 NOTE — Care Management Note (Signed)
Case Management Note  Patient Details  Name: Sarah Clarke MRN: [id number] Date of Birth: 08/05/1960  Subjective/Objective:        54yo Ms Sarah Clarke received a surgical repair of a cecal volvulus on 12/29/14. Today she has advanced to ice chips. Still has foley catheter. Anticipate home with home health. Case Management will follow for discharge planning.       Action/Plan:   Expected Discharge Date:                  Expected Discharge Plan:     In-House Referral:     Discharge planning Services     Post Acute Care Choice:    Choice offered to:     DME Arranged:    DME Agency:     HH Arranged:    [place] Agency:     Status of Service:     Medicare Important Message Given:    Date Medicare IM Given:    Medicare IM give by:    Date Additional Medicare IM Given:    Additional Medicare Important Message give by:     If discussed at [place] of Stay Meetings, dates discussed:    Additional Comments:  Derricka Mertz A, RN 12/30/2014, 3:32 PM

## 2014-12-30 NOTE — Progress Notes (Signed)
Surgery progress note  S: No acute issues, still uncomfortable O: AF/VSS, good uop GEN: NAD/A&Ox3 ABD: soft, approp tender, nondistended, dressing c/d/i  Hgb 9.9  A/P 54 yo s/p RHC for volvulus, doing well - foley for now - CBC in am - ice chips - valium for cramps, benadryl for sleep

## 2014-12-31 LAB — CBC
HCT: 23.5 % — ABNORMAL LOW (ref 35.0–47.0)
HEMOGLOBIN: 7.9 g/dL — AB (ref 12.0–16.0)
MCH: 31.3 pg (ref 26.0–34.0)
MCHC: 33.7 g/dL (ref 32.0–36.0)
MCV: 92.9 fL (ref 80.0–100.0)
PLATELETS: 242 10*3/uL (ref 150–440)
RBC: 2.53 MIL/uL — ABNORMAL LOW (ref 3.80–5.20)
RDW: 11.7 % (ref 11.5–14.5)
WBC: 9.6 10*3/uL (ref 3.6–11.0)

## 2014-12-31 LAB — SURGICAL PATHOLOGY

## 2014-12-31 NOTE — Progress Notes (Signed)
2 Days Post-Op  Subjective: Patient with pain. She had a dark bloody bowel movement earlier today of minimal volume. Patient denies dizziness.  Objective: Vital signs in last 24 hours: Temp:  [97.6 F (36.4 C)-100.4 F (38 C)] 97.6 F (36.4 C) (06/14 1525) Pulse Rate:  [97-105] 97 (06/14 1525) Resp:  [10-22] 17 (06/14 1200) BP: (113-150)/(74-99) 150/87 mmHg (06/14 1525) SpO2:  [93 %-100 %] 100 % (06/14 1525) FiO2 (%):  [98 %] 98 % (06/13 2016)    Intake/Output from previous day: 06/13 0701 - 06/14 0700 In: 1755 [I.V.:1755] Out: 1025 [Urine:1025] Intake/Output this shift: Total I/O In: 736.1 [I.V.:736.1] Out: 500 [Urine:500]  Physical exam:  Patient appears slightly uncomfortable. Abdomen is soft and nontender wound is clean no nondistended. Nontender calves  Lab Results: CBC   Recent Labs  12/30/14 0430 12/30/14 2356  WBC 11.6* 9.6  HGB 9.9* 7.9*  HCT 29.4* 23.5*  PLT 285 242   BMET  Recent Labs  12/28/14 2150 12/30/14 0430  NA 128* 135  K 3.5 3.7  CL 93* 101  CO2 25 28  GLUCOSE 114* 94  BUN 7 10  CREATININE 0.70 0.72  CALCIUM 9.6 8.0*   PT/INR No results for input(s): LABPROT, INR in the last 72 hours. ABG No results for input(s): PHART, HCO3 in the last 72 hours.  Invalid input(s): PCO2, PO2  Studies/Results: No results found.  Anti-infectives: Anti-infectives    Start     Dose/Rate Route Frequency Ordered Stop   12/29/14 0315  ertapenem (INVANZ) 1 g in sodium chloride 0.9 % 50 mL IVPB     1 g 100 mL/hr over 30 Minutes Intravenous  Once 12/29/14 0311 12/29/14 0412      Assessment/Plan: s/p Procedure(s): EXPLORATORY LAPAROTOMY with right hemicolectomy    H&H reviewed and there has been a gradual drop over the last few days. But the patient remains asymptomatic with R rate of 99 and a normal blood pressure in fact slightly hypertensive. I will type and screen the patient but I will wait on ordering any sort of transfusion as she is not  symptomatic. I do not think that she had a significant bloody bowel movement and that is likely old blood from her prior anastomosis. I discussed possible transfusion with her but will hold off at this point. We'll saline lock and start clear liquids.Florene Glen, MD, FACS  12/31/2014

## 2015-01-01 ENCOUNTER — Inpatient Hospital Stay: Payer: 59

## 2015-01-01 LAB — CBC WITH DIFFERENTIAL/PLATELET
Basophils Absolute: 0 10*3/uL (ref 0–0.1)
Basophils Relative: 0 %
EOS PCT: 4 %
Eosinophils Absolute: 0.3 10*3/uL (ref 0–0.7)
HEMATOCRIT: 27.1 % — AB (ref 35.0–47.0)
HEMOGLOBIN: 9.3 g/dL — AB (ref 12.0–16.0)
Lymphocytes Relative: 7 %
Lymphs Abs: 0.6 10*3/uL — ABNORMAL LOW (ref 1.0–3.6)
MCH: 31.8 pg (ref 26.0–34.0)
MCHC: 34.2 g/dL (ref 32.0–36.0)
MCV: 93.2 fL (ref 80.0–100.0)
MONO ABS: 1 10*3/uL — AB (ref 0.2–0.9)
Monocytes Relative: 12 %
Neutro Abs: 6.5 10*3/uL (ref 1.4–6.5)
Neutrophils Relative %: 77 %
Platelets: 334 10*3/uL (ref 150–440)
RBC: 2.91 MIL/uL — ABNORMAL LOW (ref 3.80–5.20)
RDW: 11.8 % (ref 11.5–14.5)
WBC: 8.5 10*3/uL (ref 3.6–11.0)

## 2015-01-01 LAB — TYPE AND SCREEN
ABO/RH(D): O POS
ANTIBODY SCREEN: NEGATIVE

## 2015-01-01 LAB — ABO/RH: ABO/RH(D): O POS

## 2015-01-01 MED ORDER — DIAZEPAM 5 MG PO TABS
5.0000 mg | ORAL_TABLET | Freq: Four times a day (QID) | ORAL | Status: DC | PRN
  Administered 2015-01-01 – 2015-01-03 (×7): 5 mg via ORAL
  Filled 2015-01-01 (×8): qty 1

## 2015-01-01 NOTE — Progress Notes (Signed)
3 Days Post-Op  Subjective: Patient status post cecal volvulus right hemicolectomy. She complains of back spasms. This was made better by IV Valium but seemed to sedate her a fair amount. No nausea or vomiting tolerating clears.  Objective: Vital signs in last 24 hours: Temp:  [97.6 F (36.4 C)-98.4 F (36.9 C)] 98.4 F (36.9 C) (06/15 0030) Pulse Rate:  [97-105] 103 (06/15 0030) Resp:  [14-24] 24 (06/15 0430) BP: (125-150)/(78-99) 129/78 mmHg (06/15 0030) SpO2:  [97 %-100 %] 99 % (06/15 0430)    Intake/Output from previous day: 06/14 0701 - 06/15 0700 In: 1562.1 [P.O.:120; I.V.:1442.1] Out: 1200 [Urine:1200] Intake/Output this shift:    Physical exam:  Distended and tympanitic abdomen with minimal tenderness and no peritoneal signs. Wound is clean and dry. Calves are nontender.  Lab Results: CBC   Recent Labs  12/30/14 2356 01/01/15 0449  WBC 9.6 8.5  HGB 7.9* 9.3*  HCT 23.5* 27.1*  PLT 242 334   BMET  Recent Labs  12/30/14 0430  NA 135  K 3.7  CL 101  CO2 28  GLUCOSE 94  BUN 10  CREATININE 0.72  CALCIUM 8.0*   PT/INR No results for input(s): LABPROT, INR in the last 72 hours. ABG No results for input(s): PHART, HCO3 in the last 72 hours.  Invalid input(s): PCO2, PO2  Studies/Results: No results found.  Anti-infectives: Anti-infectives    Start     Dose/Rate Route Frequency Ordered Stop   12/29/14 0315  ertapenem (INVANZ) 1 g in sodium chloride 0.9 % 50 mL IVPB     1 g 100 mL/hr over 30 Minutes Intravenous  Once 12/29/14 0311 12/29/14 0412      Assessment/Plan: s/p Procedure(s): EXPLORATORY LAPAROTOMY with right hemicolectomy    H&H has stabilized and actually improved since decreasing her IV fluids. She has no symptoms of anemia at this point. I would not recommend transfusion.  I will DC the IV Valium and switch to by mouth Valium as it may be more effective and last longer. I would also like to obtain a KUB today to look at her  abdominal distention and ensure that the patient is progressing in a positive manner.Florene Glen, MD, FACS  01/01/2015

## 2015-01-02 MED ORDER — BISACODYL 10 MG RE SUPP
10.0000 mg | Freq: Once | RECTAL | Status: AC
  Administered 2015-01-02: 10 mg via RECTAL
  Filled 2015-01-02: qty 1

## 2015-01-02 NOTE — Progress Notes (Signed)
4 Days Post-Op  Subjective: Patient is status post right hemicolectomy for volvulus. Her pain is improved however she had a single emesis early this morning. No passage of gas. She is seen this morning sitting on the commode thinking that she is going to pass gas.  Objective: Vital signs in last 24 hours: Temp:  [98.1 F (36.7 C)] 98.1 F (36.7 C) 01/27/2023 2347) Pulse Rate:  [102-111] 111 2023/01/27 2347) Resp:  [12-25] 24 (06/16 0732) BP: (137-141)/(89) 137/89 mmHg Jan 27, 2023 2347) SpO2:  [27 %-100 %] 98 % (06/16 0547) FiO2 (%):  [99 %] 99 % 2023-01-27 0832) Last BM Date: 12/31/14  Intake/Output from previous day: January 27, 2023 0701 - 06/16 0700 In: 1180 [I.V.:1180] Out: 550 [Urine:550] Intake/Output this shift: Total I/O In: 105 [I.V.:105] Out: -   Physical exam:  Complete exam not performed due to the patient sitting on the commode. She is in no acute distress. Wound not examined.  Lab Results: CBC   Recent Labs  12/30/14 2356 2015/01/27 0449  WBC 9.6 8.5  HGB 7.9* 9.3*  HCT 23.5* 27.1*  PLT 242 334   BMET No results for input(s): NA, K, CL, CO2, GLUCOSE, BUN, CREATININE, CALCIUM in the last 72 hours. PT/INR No results for input(s): LABPROT, INR in the last 72 hours. ABG No results for input(s): PHART, HCO3 in the last 72 hours.  Invalid input(s): PCO2, PO2  Studies/Results: Dg Abd 2 Views  27-Jan-2015   CLINICAL DATA:  Small bowel obstruction.  EXAM: ABDOMEN - 2 VIEW  COMPARISON:  CT scan of December 29, 2014.  FINDINGS: Midline surgical staples are noted. Mildly dilated small bowel loops are noted most consistent with postoperative ileus. No significant colonic dilatation is noted. No abnormal calcifications are noted. No definite free air is noted.  IMPRESSION: Mildly dilated small bowel loops are noted most consistent with postoperative ileus. No colonic dilatation is noted. Follow-up radiographs are recommended.   Electronically Signed   By: Marijo Conception, M.D.   On: 2015-01-27 09:04     Anti-infectives: Anti-infectives    Start     Dose/Rate Route Frequency Ordered Stop   12/29/14 0315  ertapenem (INVANZ) 1 g in sodium chloride 0.9 % 50 mL IVPB     1 g 100 mL/hr over 30 Minutes Intravenous  Once 12/29/14 0311 12/29/14 0412      Assessment/Plan: s/p Procedure(s): EXPLORATORY LAPAROTOMY with right hemicolectomy    Satisfactory progress as expected after right colon resection. Single emesis. We will see later today and hopefully she passed some gas and feels better.Florene Glen, MD, FACS  01/02/2015

## 2015-01-02 NOTE — Progress Notes (Signed)
Initial Nutrition Assessment  DOCUMENTATION CODES:     INTERVENTION:   (Nutrition Supplement Therapy:) Pt not interested in boost breeze at this time reports all items on clear liquid tray are too sweet Meals and snacks: Cater to pt prefences, will add sugar free jello and unsweet tea.  NUTRITION DIAGNOSIS:  Inadequate oral intake related to altered GI function as evidenced by other (see comment) (NPO/CL day 5 of admission).    GOAL:  Patient will meet greater than or equal to 90% of their needs    MONITOR:   (Energy intake, digestive system)  REASON FOR ASSESSMENT:  NPO/Clear Liquid Diet    ASSESSMENT:  Pt s/p right hemicolectomy for volvulus.  Noted single emesis this am, no flatus or BM  Past Medical History  Diagnosis Date  . Arthritis   . Anxiety      Current Nutrition: taking sips of water and unsweet tea   Food/Nutrition-Related History: Pt reports normal good appetite prior to admission   Labs:  Electrolyte and Renal Profile:  Recent Labs Lab 12/28/14 2150 12/30/14 0430  BUN 7 10  CREATININE 0.70 0.72  NA 128* 135  K 3.5 3.7      Medications: dulcolax, LR at 67ml/hr    Physical Findings: Nutrition-Focused physical exam completed. Findings are WDL for fat depletion, muscle depletion, and edema.     Weight Change: stable weight prior to admission   Height:  Ht Readings from Last 1 Encounters:  12/28/14 5\' 7"  (1.702 m)    Weight:  Wt Readings from Last 1 Encounters:  12/28/14 118 lb (53.524 kg)       BMI:  Body mass index is 18.48 kg/(m^2).  Estimated Nutritional Needs:  Kcal:  BEE 1167 kcals (IF 1.1-1.2, aF 1.3) 9295-7473 kcals/d.   Protein:  (1.2-1.5 g/d) 67-65-81 g/d  Fluid:  (25-42ml/kg) 1350-1658ml/d    Diet Order:  Diet clear liquid Room service appropriate?: Yes; Fluid consistency:: Thin  EDUCATION NEEDS:  No education needs identified at this time   Intake/Output Summary (Last 24 hours) at  01/02/15 1525 Last data filed at 01/02/15 1218  Gross per 24 hour  Intake 1534.3 ml  Output    200 ml  Net 1334.3 ml     MODERATE Care Level Breonna Gafford B. Zenia Resides, [place], [place] (pager)

## 2015-01-03 DIAGNOSIS — F4323 Adjustment disorder with mixed anxiety and depressed mood: Secondary | ICD-10-CM

## 2015-01-03 LAB — CBC WITH DIFFERENTIAL/PLATELET
Basophils Absolute: 0 10*3/uL (ref 0–0.1)
Basophils Relative: 1 %
Eosinophils Absolute: 0.6 10*3/uL (ref 0–0.7)
Eosinophils Relative: 9 %
HEMATOCRIT: 28.2 % — AB (ref 35.0–47.0)
Hemoglobin: 9.3 g/dL — ABNORMAL LOW (ref 12.0–16.0)
Lymphocytes Relative: 9 %
Lymphs Abs: 0.6 10*3/uL — ABNORMAL LOW (ref 1.0–3.6)
MCH: 30.4 pg (ref 26.0–34.0)
MCHC: 33 g/dL (ref 32.0–36.0)
MCV: 91.9 fL (ref 80.0–100.0)
MONO ABS: 1.2 10*3/uL — AB (ref 0.2–0.9)
Monocytes Relative: 18 %
Neutro Abs: 4.1 10*3/uL (ref 1.4–6.5)
Neutrophils Relative %: 63 %
Platelets: 455 10*3/uL — ABNORMAL HIGH (ref 150–440)
RBC: 3.07 MIL/uL — ABNORMAL LOW (ref 3.80–5.20)
RDW: 11.7 % (ref 11.5–14.5)
WBC: 6.4 10*3/uL (ref 3.6–11.0)

## 2015-01-03 LAB — BASIC METABOLIC PANEL
Anion gap: 8 (ref 5–15)
BUN: 9 mg/dL (ref 6–20)
CALCIUM: 7.9 mg/dL — AB (ref 8.9–10.3)
CO2: 27 mmol/L (ref 22–32)
Chloride: 98 mmol/L — ABNORMAL LOW (ref 101–111)
Creatinine, Ser: 0.53 mg/dL (ref 0.44–1.00)
GFR calc Af Amer: >60 mL/min (ref >60)
GFR calc non Af Amer: >60 mL/min (ref >60)
GLUCOSE: 104 mg/dL — AB (ref 65–99)
Potassium: 3.6 mmol/L (ref 3.5–5.1)
Sodium: 133 mmol/L — ABNORMAL LOW (ref 135–145)

## 2015-01-03 MED ORDER — BOOST / RESOURCE BREEZE PO LIQD
1.0000 | Freq: Three times a day (TID) | ORAL | Status: DC
  Administered 2015-01-03 – 2015-01-06 (×6): 1 via ORAL

## 2015-01-03 MED ORDER — TEMAZEPAM 15 MG PO CAPS
15.0000 mg | ORAL_CAPSULE | Freq: Every day | ORAL | Status: DC
  Administered 2015-01-03 – 2015-01-08 (×6): 15 mg via ORAL
  Filled 2015-01-03 (×6): qty 1

## 2015-01-03 MED ORDER — BISACODYL 10 MG RE SUPP
10.0000 mg | Freq: Once | RECTAL | Status: AC
  Administered 2015-01-03: 10 mg via RECTAL
  Filled 2015-01-03: qty 1

## 2015-01-03 MED ORDER — BOOST / RESOURCE BREEZE PO LIQD: 1.0000 | Freq: Three times a day (TID) | ORAL | Status: DC

## 2015-01-03 NOTE — Progress Notes (Signed)
5 Days Post-Op  Subjective: Feels a little better today but not walking enough. Has not passed much gas but feels rumbling. No further nausea or vomiting.  Objective: Vital signs in last 24 hours: Temp:  [98.4 F (36.9 C)-98.6 F (37 C)] 98.4 F (36.9 C) (06/17 0008) Pulse Rate:  [103-113] 103 (06/17 0008) Resp:  [11-19] 15 (06/17 0800) BP: (143-145)/(85-97) 145/85 mmHg (06/17 0008) SpO2:  [92 %-100 %] 98 % (06/17 0800) Last BM Date: 12/31/14  Intake/Output from previous day: 06/16 0701 - 06/17 0700 In: 1218.9 [P.O.:180; I.V.:1038.9] Out: 1450 [Urine:1450] Intake/Output this shift: Total I/O In: 168.2 [I.V.:168.2] Out: 0   Physical exam:  Abdomen is soft nontender fairly distended and tympanitic however, wound is clean no erythema or drainage. Calves are nontender  Lab Results: CBC   Recent Labs  10-Jan-2015 0449 01/03/15 0433  WBC 8.5 6.4  HGB 9.3* 9.3*  HCT 27.1* 28.2*  PLT 334 455*   BMET  Recent Labs  01/03/15 0433  NA 133*  K 3.6  CL 98*  CO2 27  GLUCOSE 104*  BUN 9  CREATININE 0.53  CALCIUM 7.9*   PT/INR No results for input(s): LABPROT, INR in the last 72 hours. ABG No results for input(s): PHART, HCO3 in the last 72 hours.  Invalid input(s): PCO2, PO2  Studies/Results: Dg Abd 2 Views  01/10/2015   CLINICAL DATA:  Small bowel obstruction.  EXAM: ABDOMEN - 2 VIEW  COMPARISON:  CT scan of December 29, 2014.  FINDINGS: Midline surgical staples are noted. Mildly dilated small bowel loops are noted most consistent with postoperative ileus. No significant colonic dilatation is noted. No abnormal calcifications are noted. No definite free air is noted.  IMPRESSION: Mildly dilated small bowel loops are noted most consistent with postoperative ileus. No colonic dilatation is noted. Follow-up radiographs are recommended.   Electronically Signed   By: Marijo Conception, M.D.   On: Jan 10, 2015 09:04    Anti-infectives: Anti-infectives    Start     Dose/Rate  Route Frequency Ordered Stop   12/29/14 0315  ertapenem (INVANZ) 1 g in sodium chloride 0.9 % 50 mL IVPB     1 g 100 mL/hr over 30 Minutes Intravenous  Once 12/29/14 0311 12/29/14 0412      Assessment/Plan: s/p Procedure(s): EXPLORATORY LAPAROTOMY with right hemicolectomy    Patient doing fairly well she has not passed gas yet and is distended but her vital signs and her labs are normal we'll repeat a Dulcolax suppository today to try to stimulate bowel function.Florene Glen, MD, FACS  01/03/2015

## 2015-01-03 NOTE — Consult Note (Addendum)
[facility] Face-to-Face Psychiatry Consult   Reason for Consult:  Consult for this 54 year old woman currently in the hospital for urgent bowel surgery. Concern over tearfulness and expressed hopelessness Referring Physician:  Burt Knack Patient Identification: MORNA FLUD MRN:  [id number] Principal Diagnosis: Adjustment disorder with mixed anxiety and depression Diagnosis:   Patient Active Problem List   Diagnosis Date Noted  . 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 female patient admitted with "it just seems like one thing after another". Patient is here for bowel surgery. For the last couple days she has appeared to be very sad and appears to be feeling hopeless.Marland Kitchen  HPI:  Information from the patient and the chart. Patient states that she has just been feeling very sad and hopeless and overwhelmed for the last couple days. Initially she told me it had only been for the last 2 days but later admitted that it probably been at least as long as she is been in the hospital. She feels frustrated by a variety of things. She is not able to sleep soundly at night. She doesn't like people coming to visit her because she doesn't like the way she currently looks, she is frustrated at the sick role in general. She is having frequent crying spells. Nevertheless she is totally denying suicidal ideation. Denies any psychotic symptoms. She is able to cite multiple positive things in her life and things she is looking forward to. The patient denies that she had been drinking excessively or using an excessive amount of medicine prior to admission.  Past psychiatric history: Patient has never seen a psychiatrist in the past. No history of being prescribed antidepressives. She was given diazepam at a low dose of Xanax by primary care doctors recently. No history of suicide attempts no history of inpatient psychiatric hospitalization.  Social  history: Patient has recently been living with her new boyfriend. She is not married and has no children of her own. Sounds like she tries to be very attentive to members of her extended family. She works as a Special educational needs teacher. Father died a few years ago and she is still mourning it.  Medical history: She is currently in the hospital for a cecal volvulus that needed emergent surgery. She is recovering from what I assume is major abdominal surgery. Not too long ago she needed neck surgery for a call fusion. She feels like it is just been one thing after another with her health recently.  Family history: No known family history of mental illness  Substance abuse history: The patient says that her drinking is only a normal amount. Denies that she's ever felt like it's a problem. She reports that she had been given Xanax and Valium as an outpatient but had never taken more than 1 a day at a low dose. I met her boyfriend out in the hallway after the interview and he told me that she drank too much and that he was concerned about how many pills she was taking.  Current medication in the hospital Valium when necessary for anxiety the law did on a PCA pump  HPI Elements:   Quality:  Sadness and tearfulness. Severity:  Moderate. No suicidal ideation. Timing:  Just for the last few days. Duration:  Not apparently part of an ongoing issue as far she is concerned. Context:  Stress because of recent illness and having to be in a sick  role.  Past Medical History:  Past Medical History  Diagnosis Date  . Arthritis   . Anxiety     Past Surgical History  Procedure Laterality Date  . Neck fusion    . Abdominal hysterectomy      Partial  . Laparotomy N/A 12/29/2014    Procedure: EXPLORATORY LAPAROTOMY with right hemicolectomy ;  Surgeon: Marlyce Huge, MD;  Location: [facility] ORS;  Service: General;  Laterality: N/A;   Family History: History reviewed. No pertinent family history. Social  History:  History  Alcohol Use  . 14.4 oz/week  . 24 Cans of beer per week     History  Drug Use Not on file    History   Social History  . Marital Status: Divorced    Spouse Name: N/A  . Number of Children: N/A  . Years of Education: N/A   Social History Main Topics  . Smoking status: Never Smoker   . Smokeless tobacco: Not on file  . Alcohol Use: 14.4 oz/week    24 Cans of beer per week  . Drug Use: Not on file  . Sexual Activity: Not Currently   Other Topics Concern  . None   Social History Narrative   Additional Social History:                          Allergies:   Allergies  Allergen Reactions  . Codeine Itching  . Morphine And Related Other (See Comments)    hallucinations    Labs:  Results for orders placed or performed during the hospital encounter of 12/28/14 (from the past 48 hour(s))  CBC with Differential/Platelet     Status: Abnormal   Collection Time: 01/03/15  4:33 AM  Result Value Ref Range   WBC 6.4 3.6 - 11.0 K/uL   RBC 3.07 (L) 3.80 - 5.20 MIL/uL   Hemoglobin 9.3 (L) 12.0 - 16.0 g/dL   HCT 28.2 (L) 35.0 - 47.0 %   MCV 91.9 80.0 - 100.0 fL   MCH 30.4 26.0 - 34.0 pg   MCHC 33.0 32.0 - 36.0 g/dL   RDW 11.7 11.5 - 14.5 %   Platelets 455 (H) 150 - 440 K/uL   Neutrophils Relative % 63 %   Neutro Abs 4.1 1.4 - 6.5 K/uL   Lymphocytes Relative 9 %   Lymphs Abs 0.6 (L) 1.0 - 3.6 K/uL   Monocytes Relative 18 %   Monocytes Absolute 1.2 (H) 0.2 - 0.9 K/uL   Eosinophils Relative 9 %   Eosinophils Absolute 0.6 0 - 0.7 K/uL   Basophils Relative 1 %   Basophils Absolute 0.0 0 - 0.1 K/uL  Basic metabolic panel     Status: Abnormal   Collection Time: 01/03/15  4:33 AM  Result Value Ref Range   Sodium 133 (L) 135 - 145 mmol/L   Potassium 3.6 3.5 - 5.1 mmol/L   Chloride 98 (L) 101 - 111 mmol/L   CO2 27 22 - 32 mmol/L   Glucose, Bld 104 (H) 65 - 99 mg/dL   BUN 9 6 - 20 mg/dL   Creatinine, Ser 0.53 0.44 - 1.00 mg/dL   Calcium 7.9 (L)  8.9 - 10.3 mg/dL   GFR calc non Af Amer >60 >60 mL/min   GFR calc Af Amer >60 >60 mL/min    Comment: (NOTE) The eGFR has been calculated using the CKD EPI equation. This calculation has not been validated in all clinical situations. eGFR's  persistently <60 mL/min signify possible Chronic Kidney Disease.    Anion gap 8 5 - 15    Vitals: Blood pressure 141/79, pulse 107, temperature 98.4 F (36.9 C), temperature source Oral, resp. rate 16, height _0  (1.702 m), weight 53.524 kg (118 lb), SpO2 98 %.  Risk to Self: Is patient at risk for suicide?: No Risk to Others:   Prior Inpatient Therapy:   Prior Outpatient Therapy:    Current Facility-Administered Medications  Medication Dose Route Frequency Provider Last Rate Last Dose  . acetaminophen (TYLENOL) tablet 650 mg  650 mg Oral Q6H PRN Marlyce Huge, MD       Or  . acetaminophen (TYLENOL) suppository 650 mg  650 mg Rectal Q6H PRN Marlyce Huge, MD      . diazepam (VALIUM) tablet 5 mg  5 mg Oral Q6H PRN Florene Glen, MD   5 mg at 01/03/15 1417  . diphenhydrAMINE (BENADRYL) injection 12.5 mg  12.5 mg Intravenous Q6H PRN Marlyce Huge, MD       Or  . diphenhydrAMINE (BENADRYL) 12.5 MG/5ML elixir 12.5 mg  12.5 mg Oral Q6H PRN Marlyce Huge, MD      . feeding supplement (RESOURCE BREEZE) (RESOURCE BREEZE) liquid 1 Container  1 Container Oral TID WC Florene Glen, MD   1 Container at 01/03/15 1700  . heparin injection 5,000 Units  5,000 Units Subcutaneous 3 times per day Marlyce Huge, MD   5,000 Units at 01/03/15 1400  . HYDROmorphone (DILAUDID) PCA injection 0.3 mg/mL   Intravenous 6 times per day Marlyce Huge, MD      . lactated ringers infusion   Intravenous Continuous Florene Glen, MD 50 mL/hr at 01/03/15 0836    . naloxone [facility]) injection 0.4 mg  0.4 mg Intravenous PRN Marlyce Huge, MD       And  . sodium chloride 0.9 % injection 9 mL  9 mL Intravenous PRN  Marlyce Huge, MD      . ondansetron [facility]) injection 4 mg  4 mg Intravenous Q6H PRN Marlyce Huge, MD   4 mg at 01/02/15 1217  . ondansetron (ZOFRAN) injection 4 mg  4 mg Intravenous Q6H PRN Marlyce Huge, MD   4 mg at 01/02/15 2012  . pantoprazole (PROTONIX) injection 40 mg  40 mg Intravenous QHS Marlyce Huge, MD   40 mg at 01/02/15 2256  . promethazine (PHENERGAN) injection 12.5 mg  12.5 mg Intravenous Q4H PRN Marlyce Huge, MD      . temazepam (RESTORIL) capsule 15 mg  15 mg Oral QHS Gonzella Lex, MD        Musculoskeletal: Strength & Muscle Tone: within normal limits Gait & Station: unsteady Patient leans: N/A  Psychiatric Specialty Exam: Physical Exam  Constitutional: She appears well-developed. She appears listless. She has a sickly appearance.  HENT:  Head: Normocephalic and atraumatic.  Eyes: Conjunctivae are normal. Pupils are equal, round, and reactive to light.  Neck: Normal range of motion.  Cardiovascular: Normal heart sounds.   Respiratory: Effort normal.  GI: Soft.  Musculoskeletal: Normal range of motion.  Neurological: She appears listless.  Skin: Skin is warm and dry.  Psychiatric: Her speech is normal and behavior is normal. Judgment and thought content normal. Her mood appears anxious. Cognition and memory are normal. She exhibits a depressed mood.  Most remarkably the patient appeared to be very sad. Cried frequently. Lucid however. Totally denied suicidal ideation.    Review of Systems  Constitutional: Positive for weight  loss and malaise/fatigue.  Eyes: Negative.   Respiratory: Negative.   Cardiovascular: Negative.   Gastrointestinal: Positive for abdominal pain.  Musculoskeletal: Positive for neck pain.  Skin: Negative.   Neurological: Positive for weakness.  Psychiatric/Behavioral: Positive for depression. Negative for suicidal ideas, hallucinations and substance abuse. The patient is nervous/anxious and has  insomnia.     Blood pressure 141/79, pulse 107, temperature 98.4 F (36.9 C), temperature source Oral, resp. rate 16, height _0  (1.702 m), weight 53.524 kg (118 lb), SpO2 98 %.Body mass index is 18.48 kg/(m^2).  General Appearance: Casual  Eye Contact::  Fair  Speech:  Normal Rate  Volume:  Decreased  Mood:  Depressed  Affect:  Tearful  Thought Process:  Tangential  Orientation:  Full (Time, Place, and Person)  Thought Content:  Negative  Suicidal Thoughts:  No  Homicidal Thoughts:  No  Memory:  Immediate;   Good Recent;   Good Remote;   Good  Judgement:  Intact  Insight:  Shallow  Psychomotor Activity:  Decreased  Concentration:  Fair  Recall:  [employer] of Knowledge:Fair  Language: Fair  Akathisia:  No  Handed:  Right  AIMS (if indicated):     Assets:  Communication Skills Desire for Improvement Social Support  ADL's:  Intact  Cognition: WNL  Sleep:      Medical Decision Making: New problem, with additional work up planned, Review of Psycho-Social Stressors (1), Review or order clinical lab tests (1) and Review of Medication Regimen & Side Effects (2)  Treatment Plan Summary: Medication management and Plan This is a 54 year old woman with acute tearfulness and anxiety. She does not report anhedonia or hopelessness in the longer term sense. She appears to be more acutely feeling overwhelmed. No evidence of psychosis. No suicidal ideation. I am not sure that this is a major depression so much as a reaction to having to be in a sick role in the hospital. I also note her boyfriends concern that he voiced to me about substance use which the patient had denied. I'm not going to start an antidepressive. I did start a low dose of medicine to help with sleep. I won't change anything else. Follow up over the weekend.  Plan:  No evidence of imminent risk to self or others at present.   Patient does not meet criteria for psychiatric inpatient admission. Patient is prescribed a low  dose of Restoril to assist with sleep. Supportive and psychoeducational counseling completed. I will follow up over the weekend. I am deferring any decision about the antidepressive treatment. I will try to pursue a little bit more about the substance question over the weekend Disposition: Continue monitoring in the hospital  [place] 01/03/2015 7:09 PM

## 2015-01-03 NOTE — Care Management (Signed)
Patient remains on PCA for pain.  She is refusing to get out of bed.  Has not had a BM Was started on 02 last pm as dropped sats into the 80's last night.  Discussed druing progression to strongly encourage patient to ambulate- PT consult if necessary.  Discussed psych consult and primary nurse states there may be some underlying psych issues that is impeding progress.  CM also spoke with patient

## 2015-01-03 NOTE — Progress Notes (Signed)
Nutrition Follow-up       INTERVENTION:   (Nutrition Supplement Therapy:) Spoke with Dr. Burt Knack and agreeable to add boost breeze (clear liquid) supplement TID for added kcals and protein. Discussed with pt and agreeable to try. Encouraged pt to drink boost breeze over ice and with splash of gingerale to decrease sweetness  NUTRITION DIAGNOSIS:  Inadequate oral intake related to altered GI function as evidenced by  (NPO/CL > 4 days), ongoing   GOAL:  Patient will meet greater than or equal to 90% of their needs    MONITOR:   (Energy intake, digestive system)  REASON FOR ASSESSMENT:  NPO/Clear Liquid Diet    ASSESSMENT:  Noted per MD planning to repeat dulcolax today  Digestive system: no gas, burping per pt, small BM noted on 6/16, no vomiting since am of 6/16, reports some nausea  Pt tearful this am.  Current Nutrition: Taking ice chips this am, still reporting everything is so sweet on clear liquid diet, sugar free items requested yesterday. Limited intake day 6  Electrolyte and Renal Profile:  Recent Labs Lab 12/28/14 2150 12/30/14 0430 01/03/15 0433  BUN 7 10 9   CREATININE 0.70 0.72 0.53  NA 128* 135 133*  K 3.5 3.7 3.6     Height:  Ht Readings from Last 1 Encounters:  12/28/14 5\' 7"  (1.702 m)    Weight:  Wt Readings from Last 1 Encounters:  12/28/14 118 lb (53.524 kg)        BMI:  Body mass index is 18.48 kg/(m^2).  Estimated Nutritional Needs:  Kcal:  BEE 1167 kcals (IF 1.1-1.2, aF 1.3) 4196-2229 kcals/d.   Protein:  (1.2-1.5 g/d) 67-65-81 g/d  Fluid:  (25-108ml/kg) 1350-1679ml/d  Skin:  Reviewed, no issues  Diet Order:  Diet clear liquid Room service appropriate?: Yes; Fluid consistency:: Thin  EDUCATION NEEDS:  No education needs identified at this time   Intake/Output Summary (Last 24 hours) at 01/03/15 0846 Last data filed at 01/03/15 0750  Gross per 24 hour  Intake 1282.12 ml  Output   1450 ml  Net -167.88 ml     MODERATE Care Level Sarah Clarke Sarah Clarke, [place], [place] (pager)

## 2015-01-04 NOTE — Progress Notes (Signed)
6 Days Post-Op  Subjective: Feels better today less pain less muscle cramping no nausea or vomiting and passing gas.  Appreciate psych consult.  Objective: Vital signs in last 24 hours: Temp:  [98.2 F (36.8 C)-98.3 F (36.8 C)] 98.3 F (36.8 C) (06/18 0748) Pulse Rate:  [98-104] 98 (06/18 0748) Resp:  [12-23] 14 (06/18 0755) BP: (108-136)/(70-78) 136/78 mmHg (06/18 0748) SpO2:  [95 %-98 %] 98 % (06/18 0755) FiO2 (%):  [96 %-98 %] 96 % (06/17 2034) Last BM Date: 01/03/15  Intake/Output from previous day: 06/17 0701 - 06/18 0700 In: 1737.2 [P.O.:600; I.V.:1137.2] Out: 500 [Urine:500] Intake/Output this shift: Total I/O In: 128.8 [I.V.:128.8] Out: 0   Physical exam:  Abdomen remains distended and tympanitic but wound is clean without erythema or drainage and abdomen is less tender and essentially nontender today. Calves are nontender  Lab Results: CBC   Recent Labs  01/03/15 0433  WBC 6.4  HGB 9.3*  HCT 28.2*  PLT 455*   BMET  Recent Labs  01/03/15 0433  NA 133*  K 3.6  CL 98*  CO2 27  GLUCOSE 104*  BUN 9  CREATININE 0.53  CALCIUM 7.9*   PT/INR No results for input(s): LABPROT, INR in the last 72 hours. ABG No results for input(s): PHART, HCO3 in the last 72 hours.  Invalid input(s): PCO2, PO2  Studies/Results: No results found.  Anti-infectives: Anti-infectives    Start     Dose/Rate Route Frequency Ordered Stop   12/29/14 0315  ertapenem (INVANZ) 1 g in sodium chloride 0.9 % 50 mL IVPB     1 g 100 mL/hr over 30 Minutes Intravenous  Once 12/29/14 0311 12/29/14 0412      Assessment/Plan: s/p Procedure(s): EXPLORATORY LAPAROTOMY with right hemicolectomy    Improved today, psych consult reviewed. Recommend advancing diet today and continue PCA for now*  Florene Glen, MD, FACS  01/04/2015

## 2015-01-05 MED ORDER — PANTOPRAZOLE SODIUM 40 MG PO TBEC
40.0000 mg | DELAYED_RELEASE_TABLET | Freq: Every day | ORAL | Status: DC
Start: 2015-01-05 — End: 2015-01-09
  Administered 2015-01-05 – 2015-01-09 (×5): 40 mg via ORAL
  Filled 2015-01-05 (×5): qty 1

## 2015-01-05 MED ORDER — HYDROMORPHONE HCL 1 MG/ML IJ SOLN
0.5000 mg | INTRAMUSCULAR | Status: DC | PRN
  Administered 2015-01-05 – 2015-01-07 (×4): 0.5 mg via INTRAVENOUS
  Filled 2015-01-05 (×4): qty 1

## 2015-01-05 MED ORDER — OXYCODONE-ACETAMINOPHEN 5-325 MG PO TABS
1.0000 | ORAL_TABLET | ORAL | Status: DC | PRN
  Administered 2015-01-05 – 2015-01-09 (×18): 1 via ORAL
  Filled 2015-01-05 (×18): qty 1

## 2015-01-05 NOTE — Consult Note (Signed)
[facility] Face-to-Face Psychiatry Consult   Reason for Consult:  Consult for this 54-year-old woman currently in the hospital for urgent bowel surgery. Concern over tearfulness and expressed hopelessness Referring Physician:  Cooper Patient Identification: Sarah Clarke MRN:  [id number] Principal Diagnosis: Adjustment disorder with mixed anxiety and depression Diagnosis:   Patient Active Problem List   Diagnosis Date Noted  . Adjustment disorder with mixed anxiety and depressed mood [F43.23] 01/03/2015  . Cecal volvulus [K56.2] 12/29/2014    Total Time spent with patient: 1 hour  Subjective:  Up-to-date as of Saturday the 18th. Patient seen in her room. Chart reviewed. Patient says she is feeling much better. She got a decent night sleep last night. Affect was much less tearful. Mood was stated as better. She seems to be more hopeful about the future. Patient continues to minimize or deny any substance problems outside of the hospital. Medically she seems to be more adjusted to her condition and recovering. Sarah Clarke is a 54 y.o. female patient admitted with "it just seems like one thing after another". Patient is here for bowel surgery. For the last couple days she has appeared to be very sad and appears to be feeling hopeless..  HPI:  Information from the patient and the chart. Patient states that she has just been feeling very sad and hopeless and overwhelmed for the last couple days. Initially she told me it had only been for the last 2 days but later admitted that it probably been at least as long as she is been in the hospital. She feels frustrated by a variety of things. She is not able to sleep soundly at night. She doesn't like people coming to visit her because she doesn't like the way she currently looks, she is frustrated at the sick role in general. She is having frequent crying spells. Nevertheless she is totally denying suicidal ideation. Denies any psychotic symptoms. She is able  to cite multiple positive things in her life and things she is looking forward to. The patient denies that she had been drinking excessively or using an excessive amount of medicine prior to admission.  Past psychiatric history: Patient has never seen a psychiatrist in the past. No history of being prescribed antidepressives. She was given diazepam at a low dose of Xanax by primary care doctors recently. No history of suicide attempts no history of inpatient psychiatric hospitalization.  Social history: Patient has recently been living with her new boyfriend. She is not married and has no children of her own. Sounds like she tries to be very attentive to members of her extended family. She works as a self-employed bookkeeper. Father died a few years ago and she is still mourning it.  Medical history: She is currently in the hospital for a cecal volvulus that needed emergent surgery. She is recovering from what I assume is major abdominal surgery. Not too long ago she needed neck surgery for a call fusion. She feels like it is just been one thing after another with her health recently.  Family history: No known family history of mental illness  Substance abuse history: The patient says that her drinking is only a normal amount. Denies that she's ever felt like it's a problem. She reports that she had been given Xanax and Valium as an outpatient but had never taken more than 1 a day at a low dose. I met her boyfriend out in the hallway after the interview and he told me that she drank too   much and that he was concerned about how many pills she was taking.  Current medication in the hospital Valium when necessary for anxiety dilaudid on a PCA pump  HPI Elements:   Quality:  Sadness and tearfulness. Severity:  Moderate. No suicidal ideation. Timing:  Just for the last few days. Duration:  Not apparently part of an ongoing issue as far she is concerned. Context:  Stress because of recent illness and  having to be in a sick role.  Past Medical History:  Past Medical History  Diagnosis Date  . Arthritis   . Anxiety     Past Surgical History  Procedure Laterality Date  . Neck fusion    . Abdominal hysterectomy      Partial  . Laparotomy N/A 12/29/2014    Procedure: EXPLORATORY LAPAROTOMY with right hemicolectomy ;  Surgeon: Marlyce Huge, MD;  Location: [facility] ORS;  Service: General;  Laterality: N/A;   Family History: History reviewed. No pertinent family history. Social History:  History  Alcohol Use  . 14.4 oz/week  . 24 Cans of beer per week     History  Drug Use Not on file    History   Social History  . Marital Status: Divorced    Spouse Name: N/A  . Number of Children: N/A  . Years of Education: N/A   Social History Main Topics  . Smoking status: Never Smoker   . Smokeless tobacco: Not on file  . Alcohol Use: 14.4 oz/week    24 Cans of beer per week  . Drug Use: Not on file  . Sexual Activity: Not Currently   Other Topics Concern  . None   Social History Narrative   Additional Social History:                          Allergies:   Allergies  Allergen Reactions  . Codeine Itching  . Morphine And Related Other (See Comments)    hallucinations    Labs:  No results found for this or any previous visit (from the past 48 hour(s)).  Vitals: Blood pressure 142/80, pulse 103, temperature 98.2 F (36.8 C), temperature source Oral, resp. rate 19, height 5' 7" (1.702 m), weight 53.524 kg (118 lb), SpO2 100 %.  Risk to Self: Is patient at risk for suicide?: No Risk to Others:   Prior Inpatient Therapy:   Prior Outpatient Therapy:    Current Facility-Administered Medications  Medication Dose Route Frequency Provider Last Rate Last Dose  . acetaminophen (TYLENOL) tablet 650 mg  650 mg Oral Q6H PRN Marlyce Huge, MD       Or  . acetaminophen (TYLENOL) suppository 650 mg  650 mg Rectal Q6H PRN Marlyce Huge, MD      .  diazepam (VALIUM) tablet 5 mg  5 mg Oral Q6H PRN Florene Glen, MD   5 mg at 01/03/15 1417  . feeding supplement (RESOURCE BREEZE) (RESOURCE BREEZE) liquid 1 Container  1 Container Oral TID WC Florene Glen, MD   1 Container at 01/04/15 1700  . heparin injection 5,000 Units  5,000 Units Subcutaneous 3 times per day Marlyce Huge, MD   5,000 Units at 01/05/15 0552  . HYDROmorphone (DILAUDID) injection 0.5 mg  0.5 mg Intravenous Q3H PRN Florene Glen, MD   0.5 mg at 01/05/15 1244  . ondansetron (ZOFRAN) injection 4 mg  4 mg Intravenous Q6H PRN Marlyce Huge, MD   4 mg at 01/02/15 1217  .  oxyCODONE-acetaminophen (PERCOCET/ROXICET) 5-325 MG per tablet 1 tablet  1 tablet Oral Q4H PRN Richard E Cooper, MD      . pantoprazole (PROTONIX) EC tablet 40 mg  40 mg Oral Daily Christopher Lundquist, MD   40 mg at 01/05/15 1007  . promethazine (PHENERGAN) injection 12.5 mg  12.5 mg Intravenous Q4H PRN Christopher Lundquist, MD      . temazepam (RESTORIL) capsule 15 mg  15 mg Oral QHS John T Clapacs, MD   15 mg at 01/04/15 2201    Musculoskeletal: Strength & Muscle Tone: within normal limits Gait & Station: unsteady Patient leans: N/A  Psychiatric Specialty Exam: Physical Exam  Constitutional: She appears well-developed. She appears listless. She has a sickly appearance.  HENT:  Head: Normocephalic and atraumatic.  Eyes: Conjunctivae are normal. Pupils are equal, round, and reactive to light.  Neck: Normal range of motion.  Cardiovascular: Normal heart sounds.   Respiratory: Effort normal.  GI: Soft.  Musculoskeletal: Normal range of motion.  Neurological: She appears listless.  Skin: Skin is warm and dry.  Psychiatric: Her speech is normal and behavior is normal. Judgment and thought content normal. Her mood appears not anxious. Cognition and memory are normal. She does not exhibit a depressed mood.  Mood and affect were better today. Less tearful. Heels more hopeful. No  suicidal ideation.    Review of Systems  Constitutional: Positive for weight loss and malaise/fatigue.  Eyes: Negative.   Respiratory: Negative.   Cardiovascular: Negative.   Gastrointestinal: Positive for abdominal pain.  Musculoskeletal: Positive for neck pain.  Skin: Negative.   Neurological: Positive for weakness.  Psychiatric/Behavioral: Negative for depression, suicidal ideas, hallucinations and substance abuse. The patient is not nervous/anxious and does not have insomnia.     Blood pressure 142/80, pulse 103, temperature 98.2 F (36.8 C), temperature source Oral, resp. rate 19, height 5' 7" (1.702 m), weight 53.524 kg (118 lb), SpO2 100 %.Body mass index is 18.48 kg/(m^2).  General Appearance: Casual  Eye Contact::  Fair  Speech:  Normal Rate  Volume:  Decreased  Mood:  Depressed  Affect:  Tearful  Thought Process:  Tangential  Orientation:  Full (Time, Place, and Person)  Thought Content:  Negative  Suicidal Thoughts:  No  Homicidal Thoughts:  No  Memory:  Immediate;   Good Recent;   Good Remote;   Good  Judgement:  Intact  Insight:  Shallow  Psychomotor Activity:  Decreased  Concentration:  Fair  Recall:  Fair  Fund of Knowledge:Fair  Language: Fair  Akathisia:  No  Handed:  Right  AIMS (if indicated):     Assets:  Communication Skills Desire for Improvement Social Support  ADL's:  Intact  Cognition: WNL  Sleep:      Medical Decision Making: New problem, with additional work up planned, Review of Psycho-Social Stressors (1), Review or order clinical lab tests (1) and Review of Medication Regimen & Side Effects (2)  Treatment Plan Summary: Medication management and Plan This is a 54-year-old woman with acute tearfulness and anxiety. She does not report anhedonia or hopelessness in the longer term sense. She appears to be more acutely feeling overwhelmed. No evidence of psychosis. No suicidal ideation. I am not sure that this is a major depression so much as a  reaction to having to be in a sick role in the hospital. I also note her boyfriends concern that he voiced to me about substance use which the patient had denied. I'm not going to start an   antidepressive. I did start a low dose of medicine to help with sleep. I won't change anything else. Follow up over the weekend.  Plan:  No evidence of imminent risk to self or others at present.   Patient does not meet criteria for psychiatric inpatient admission. Patient is prescribed a low dose of Restoril to assist with sleep. Supportive and psychoeducational counseling completed. I will follow up over the weekend. I am deferring any decision about the antidepressive treatment. I will try to pursue a little bit more about the substance question over the weekend patient will continue on current medicine. Continue Restoril assisting with sleep. No addition of antidepressives at this point. No indication of acute dangerousness. Supportive and educational counseling completed.  Disposition: Continue monitoring in the hospital  John Clapacs 01/05/2015 1:24 PM  

## 2015-01-05 NOTE — Progress Notes (Signed)
7 Days Post-Op  Subjective: Postop right hemicolectomy for volvulus. Feels better every day but seems sedated. Passing some gas and tolerating diet.  Objective: Vital signs in last 24 hours: Temp:  [98.9 F (37.2 C)-99.6 F (37.6 C)] 99.6 F (37.6 C) (06/18 2334) Pulse Rate:  [94-99] 94 (06/18 2334) Resp:  [13-24] 15 (06/19 0437) BP: (124-134)/(66-76) 124/66 mmHg (06/18 2334) SpO2:  [91 %-100 %] 98 % (06/19 0437) Last BM Date: 01/03/15  Intake/Output from previous day: 06/18 0701 - 06/19 0700 In: 1655.1 [P.O.:660; I.V.:995.1] Out: 600 [Urine:600] Intake/Output this shift:    Physical exam:  Quite distended abdomen with clean wound. Nontender calves  Lab Results: CBC   Recent Labs  01/03/15 0433  WBC 6.4  HGB 9.3*  HCT 28.2*  PLT 455*   BMET  Recent Labs  01/03/15 0433  NA 133*  K 3.6  CL 98*  CO2 27  GLUCOSE 104*  BUN 9  CREATININE 0.53  CALCIUM 7.9*   PT/INR No results for input(s): LABPROT, INR in the last 72 hours. ABG No results for input(s): PHART, HCO3 in the last 72 hours.  Invalid input(s): PCO2, PO2  Studies/Results: No results found.  Anti-infectives: Anti-infectives    Start     Dose/Rate Route Frequency Ordered Stop   12/29/14 0315  ertapenem (INVANZ) 1 g in sodium chloride 0.9 % 50 mL IVPB     1 g 100 mL/hr over 30 Minutes Intravenous  Once 12/29/14 0311 12/29/14 0412      Assessment/Plan: s/p Procedure(s): EXPLORATORY LAPAROTOMY with right hemicolectomy    Improving every day but slowly still with postop ileus with distention. Much of this is because the patient does not get out of bed very much. I will DC the PCA and IVs today and switch to oral and IV push analgesia in an attempt to get the patient moving more.Florene Glen, MD, FACS  01/05/2015

## 2015-01-05 NOTE — Care Management Note (Signed)
Case Management Note  Patient Details  Name: Sarah Clarke MRN: [id number] Date of Birth: [birth year]  Subjective/Objective:     Today is POD# 7 after her right hemicolectomy. Discussed possible need for PT Consult with Ms [facility] nurse Raquel Sarna. PCA pump was discontinued today and Raquel Sarna is assisting Ms Giel to get up and ambulate at this point because she is a high fall risk. Anticipate discharge home in 1-2 days.                Action/Plan:   Expected Discharge Date:                  Expected Discharge Plan:     In-House Referral:     Discharge planning Services     Post Acute Care Choice:    Choice offered to:     DME Arranged:    DME Agency:     HH Arranged:    [place] Agency:     Status of Service:     Medicare Important Message Given:    Date Medicare IM Given:    Medicare IM give by:    Date Additional Medicare IM Given:    Additional Medicare Important Message give by:     If discussed at [place] of Stay Meetings, dates discussed:    Additional Comments:  Ziggy Chanthavong A, RN 01/05/2015, 4:54 PM

## 2015-01-06 NOTE — Progress Notes (Signed)
Nutrition Follow-up    INTERVENTION:  Meals/Snacks: cater to pt preferences; recommend advancing diet as tolerated to Soft  Medical Food Supplement Therapy: pt does not like Boost Breeze, does not want to receive anymore. Pt agreeable to trying [employer] supplement; will send TID on meal trays.   NUTRITION DIAGNOSIS:  Inadequate oral intake related to altered GI function as evidenced by  (NPO/CL > 4 days). Improving as pt on FL diet, tolerating   GOAL:  Patient will meet greater than or equal to 90% of their needs   MONITOR:   (Energy intake, digestive system)     ASSESSMENT:  Diet Order: FL  Energy Intake: pt reports she is tolerating FL diet, ready for solid food. Pt says she cant wait to eat a grilled cheese or something from [employer]: distended abdomen, +loose BM today, no N/V  Electrolyte and Renal Profile:  Recent Labs Lab 01/03/15 0433  BUN 9  CREATININE 0.53  NA 133*  K 3.6    Height:  Ht Readings from Last 1 Encounters:  12/28/14 5\' 7"  (1.702 m)    Weight:  Wt Readings from Last 1 Encounters:  12/28/14 118 lb (53.524 kg)    Filed Weights   12/28/14 2110  Weight: 118 lb (53.524 kg)     BMI:  Body mass index is 18.48 kg/(m^2).  Estimated Nutritional Needs:  Kcal:  BEE 1167 kcals (IF 1.1-1.2, aF 1.3) 9480-1655 kcals/d.   Protein:  (1.2-1.5 g/d) 67-65-81 g/d  Fluid:  (25-50ml/kg) 1350-1671ml/d  Skin:  Reviewed, no issues  EDUCATION NEEDS:  No education needs identified at this time   Intake/Output Summary (Last 24 hours) at 01/06/15 1514 Last data filed at 01/06/15 1158  Gross per 24 hour  Intake    720 ml  Output   2300 ml  Net  -1580 ml    HIGH Care Level  Kerman Passey MS, RD, LDN [phone] Pager

## 2015-01-06 NOTE — Evaluation (Signed)
Physical Therapy Evaluation Patient Details Name: Sarah Clarke MRN: [id number] DOB: [birth year] Today's Date: 01/06/2015   History of Present Illness  presented to ER secondary to acute onset of severe and worsening abdominal pain, nausea/vomiting; admitted with cecal volvulus.  Status post exploratory laparotomy with R hemicolectomy (1/95); complicated by post-op ileus.  Clinical Impression  Upon evaluation, patient alert and oriented, follows all commands and demonstrates good safety awareness/insight.  Bilat UE/LE strength and ROM grossly WFL; all movement very slow and guarded due to abdominal pain (rated at 6/10).  Midline incision with mild amount of old drainage; unchanged with mobility.  Currently requiring min assist for bed mobility; cga for sit/stand, basic transfers and gait (220') with RW.  Decreased cadence and gait speed; no overt LOB.  Encouraged progressive activity around unit with +1 sup/assist (goal of 3x/day); patient voiced understanding. Would benefit from skilled PT to address above deficits and promote optimal return to PLOF;Recommend transition to [facility] upon discharge from acute hospitalization.     Follow Up Recommendations Home health PT    Equipment Recommendations  Rolling walker with 5" wheels    Recommendations for Other Services       Precautions / Restrictions Precautions Precautions: Fall Restrictions Weight Bearing Restrictions: No      Mobility  Bed Mobility Overal bed mobility: Needs Assistance Bed Mobility: Supine to Sit     Supine to sit: Min assist     General bed mobility comments: educated in log rolling; heavy use of bedrails to complete  Transfers Overall transfer level: Needs assistance Equipment used: Rolling walker (2 wheeled) Transfers: Sit to/from Stand Sit to Stand: Min guard            Ambulation/Gait Ambulation/Gait assistance: Min guard Ambulation Distance (Feet): 220 Feet Assistive device: Rolling walker (2  wheeled)     Gait velocity interpretation: <1.8 ft/sec, indicative of risk for recurrent falls General Gait Details: forward flexed posture (approx 20-30 degrees); reciprocal stepping pattern.  Very slow and guarded.  Single standing rest break required to complete distance.  Stairs            Wheelchair Mobility    Modified Rankin (Stroke Patients Only)       Balance Overall balance assessment: Needs assistance Sitting-balance support: No upper extremity supported;Feet supported Sitting balance-Leahy Scale: Normal     Standing balance support: No upper extremity supported;Bilateral upper extremity supported Standing balance-Leahy Scale: Fair                 High Level Balance Comments: decreased reaction time; guarded due to pain             Pertinent Vitals/Pain Pain Assessment: 0-10 Pain Score: 6  Pain Location: abdomen Pain Descriptors / Indicators: Aching;Sore Pain Intervention(s): Limited activity within patient's tolerance;Premedicated before session;Repositioned    Home Living Family/patient expects to be discharged to:: Private residence Living Arrangements: Spouse/significant other Available Help at Discharge: Family Type of Home: House Home Access: Stairs to enter Entrance Stairs-Rails: None Technical brewer of Steps: 4 Home Layout: Two level;Bed/bath upstairs Home Equipment: None Additional Comments: can discharge to brother's home if needed--one level with 4 steps (available to assist as needed)    Prior Function Level of Independence: Independent         Comments: Indep with all hosuehold/community mobility; + driving; active lifestyle.  Works full-time as Environmental consultant        Extremity/Trunk Assessment   Upper Extremity Assessment: Overall  WFL for tasks assessed           Lower Extremity Assessment: Overall WFL for tasks assessed (slow and guarded; educated on abdominal bracing)          Communication   Communication: No difficulties  Cognition Arousal/Alertness: Awake/alert Behavior During Therapy: WFL for tasks assessed/performed Overall Cognitive Status: Within Functional Limits for tasks assessed                      General Comments General comments (skin integrity, edema, etc.): midline abdominal incision with honeycomb dressing--small amount of dried drianage noted; unchaged with mobility    Exercises Other Exercises Other Exercises: Supine LE therex, 1x10, AROM for muscular strength/endurance with functional activities: ankle pumps, SAQs, heel slides, hip abduct/adduct.  Educated in log rolling with bed mobility. Other Exercises: Toilet transfer, SPT with RW, cga; sit/stand for hygiene, clothing management with RW, cga. (15 minutes)      Assessment/Plan    PT Assessment Patient needs continued PT services  PT Diagnosis Generalized weakness;Difficulty walking   PT Problem List Decreased strength;Decreased balance;Decreased mobility;Decreased activity tolerance;Pain;Decreased skin integrity  PT Treatment Interventions DME instruction;Gait training;Stair training;Functional mobility training;Therapeutic activities;Therapeutic exercise;Patient/family education;Balance training   PT Goals (Current goals can be found in the Care Plan section) Acute Rehab PT Goals Patient Stated Goal: "to move around a little bit" PT Goal Formulation: With patient Time For Goal Achievement: 01/20/15 Potential to Achieve Goals: Good    Frequency Min 2X/week   Barriers to discharge Inaccessible home environment;Decreased caregiver support (will need to negotiate full flight of stairs for access to bed/bathroom in home environment)      Co-evaluation               End of Session   Activity Tolerance: Patient tolerated treatment well Patient left: with call bell/phone within reach;with chair alarm set Nurse Communication: Mobility status         Time:  9678-9381 PT Time Calculation (min) (ACUTE ONLY): 50 min   Charges:   PT Evaluation $Initial PT Evaluation Tier I: 1 Procedure PT Treatments $Therapeutic Exercise: 8-22 mins   PT G Codes:        Shannelle Alguire H. Owens Shark, PT, DPT, NCS 01/06/2015, 9:56 AM [phone]

## 2015-01-06 NOTE — Care Management Note (Signed)
Case Management Note  Patient Details  Name: MERRIDY PASCOE MRN: [id number] Date of Birth: 01-31-1961  Subjective/Objective:             [facility] PT is recommending home with home health PT. Approached Ms Kincannon about discharge planning and Ms Stankowski insists that Dr Rexene Edison told her earlier this morning that she would not be discharged from the hospital until the end of the week. Will approach Ms Tegethoff again later about discharge planning. Today is POD#8. Called Dr Rexene Edison about anticipated date of discharge and he reported 2-3 more days of inpatient per abdominal distention.        Action/Plan:    Needs to choose a home health provider.   Expected Discharge Date:                  Expected Discharge Plan:     In-House Referral:     Discharge planning Services     Post Acute Care Choice:    Choice offered to:     DME Arranged:    DME Agency:     HH Arranged:    [place] Agency:     Status of Service:     Medicare Important Message Given:    Date Medicare IM Given:    Medicare IM give by:    Date Additional Medicare IM Given:    Additional Medicare Important Message give by:     If discussed at [place] of Stay Meetings, dates discussed:    Additional Comments:  Virgilio Broadhead A, RN 01/06/2015, 11:34 AM

## 2015-01-06 NOTE — Progress Notes (Signed)
Surgery Progress Note  S: C/o weakness, + BM  O: AF/VSS, good uop GEN: NAD/A&Ox3 ABD: Soft, min tender, dressing c/d/i, moderate distention  A/P 54 yo s/p right hemicolectomy for volvulus, doing well - full liquids - PT consult

## 2015-01-07 LAB — PLATELET COUNT: Platelets: 779 10*3/uL — ABNORMAL HIGH (ref 150–440)

## 2015-01-07 NOTE — Consult Note (Signed)
  Psychiatry: Follow-up for 54 year old woman recovering from bowel surgery. She states that she is continuing to improve and feel better but still gets anxious and overwhelmed at times. Occasional tearful episodes. Not feeling as bad as previously certainly no suicidal ideation or psychosis.  Continues to complain of abdominal pain but is tolerating it. No hallucinations no suicidal ideation.  On mental status she still gets tearful pretty easily but is not as agitated as she was before. I suggested to her the possibility of treating depression but she really prefers not to take antidepressive medicine. Talked with her instead about her habitual problems with being in control. She seems to be trying to deal with that better. No acute dangerousness. No change to medicine. We'll follow-up as needed.

## 2015-01-07 NOTE — Progress Notes (Signed)
Physical Therapy Treatment Patient Details Name: Sarah Clarke MRN: [id number] DOB: 01/05/1961 Today's Date: 01/07/2015    History of Present Illness presented to ER secondary to acute onset of severe and worsening abdominal pain, nausea/vomiting; admitted with cecal volvulus.  Status post exploratory laparotomy with R hemicolectomy (8/93); complicated by post-op ileus.    PT Comments    Progressive increase in gait distance and activity tolerance this date; remains slow and guarded, but improving from previous session.  Will plan to trial stairs next date.  Follow Up Recommendations  Home health PT     Equipment Recommendations  Rolling walker with 5" wheels    Recommendations for Other Services       Precautions / Restrictions Precautions Precautions: Fall Restrictions Weight Bearing Restrictions: No    Mobility  Bed Mobility Overal bed mobility: Modified Independent Bed Mobility: Supine to Sit           General bed mobility comments: use of bedrails to complete  Transfers Overall transfer level: Needs assistance Equipment used: Rolling walker (2 wheeled) Transfers: Sit to/from Stand Sit to Stand: Min guard;Supervision         General transfer comment: good hand placement and technique  Ambulation/Gait Ambulation/Gait assistance: Supervision;Min guard Ambulation Distance (Feet): 250 Feet Assistive device: Rolling walker (2 wheeled)       General Gait Details: improving postural extension; reciprocal stepping pattern.  Very slow and guarded, but improved from previous session.  Able to complete without standing rest break this date.   Stairs            Wheelchair Mobility    Modified Rankin (Stroke Patients Only)       Balance                                    Cognition                            Exercises Other Exercises Other Exercises: Toilet transfer, ambulatory with RW, close sup. Able to negotiate  surface changes, obstacles without LOB.  sit/stand from standard toilet height, sup with useo f grab bar.  Standing balance for clothing management, close sup. Other Exercises: Grooming and oral care at sink, standing with RW, sup.  Good standing tolerance; improving postural extension. Other Exercises: Issued handouts with seated/standing LE therex for use outside of therapy; encouraged sup/assist and use of RW with standing therex.  Patient voiced understanding.    General Comments        Pertinent Vitals/Pain Pain Score: 5  Pain Descriptors / Indicators: Aching;Sore Pain Intervention(s): Limited activity within patient's tolerance;Premedicated before session;Repositioned;Monitored during session    Home Living                      Prior Function            PT Goals (current goals can now be found in the care plan section) Acute Rehab PT Goals Patient Stated Goal: "to move around a little bit" PT Goal Formulation: With patient Time For Goal Achievement: 01/20/15 Potential to Achieve Goals: Good Progress towards PT goals: Progressing toward goals    Frequency  Min 2X/week    PT Plan Current plan remains appropriate    Co-evaluation             End of Session Equipment Utilized During Treatment: Gait  belt Activity Tolerance: Patient tolerated treatment well Patient left: in chair;with call bell/phone within reach;with chair alarm set;with family/visitor present     Time: 2712-9290 PT Time Calculation (min) (ACUTE ONLY): 29 min  Charges:  $Gait Training: 8-22 mins $Therapeutic Activity: 8-22 mins                    G Codes:      Shadoe Bethel H. Owens Shark, PT, DPT, NCS 01/07/2015, 12:52 PM [phone]

## 2015-01-07 NOTE — Progress Notes (Signed)
Surgery Progress Note  S: Ambulated yesterday.  Still c/o weakness O: AF/VSS, good uop GEN: NAD/A&Ox3 ABD: soft, min tender, less distended, incision c/d/i  A/P 54 yo s/p right hemicolectomy, doing well - regulr diet - dispo planning

## 2015-01-07 NOTE — Care Management (Signed)
Spoke with patient regarding home health services. Patient will need HHPT. Choice provided and patient picked [facility]. She will also need a rolling walker prior to discharge which has been requested from [facility]. Her PCP is Dr. Clayborn Bigness. She plans to return home with her brother Kerry Dory which can provide transportation. Referral called to [place].

## 2015-01-08 NOTE — Progress Notes (Signed)
Physical Therapy Treatment Patient Details Name: Sarah Clarke MRN: [id number] DOB: [birth year] Today's Date: 01/08/2015    History of Present Illness presented to ER secondary to acute onset of severe and worsening abdominal pain, nausea/vomiting; admitted with cecal volvulus.  Status post exploratory laparotomy with R hemicolectomy (8/18); complicated by post-op ileus.    PT Comments    Able to initiate stairs this date, completing up/down 6 with R rail and L HHA, min assist, from therapist.  Progressing towards mod indep for basic transfers, mobility; remains slow and guarded.  Follow Up Recommendations  Home health PT     Equipment Recommendations  Rolling walker with 5" wheels    Recommendations for Other Services       Precautions / Restrictions Precautions Precautions: Fall Restrictions Weight Bearing Restrictions: No    Mobility  Bed Mobility               General bed mobility comments: seated in chair beginning/end of session  Transfers Overall transfer level: Modified independent Equipment used: Rolling walker (2 wheeled) Transfers: Sit to/from Stand           General transfer comment: good hand placement and technique  Ambulation/Gait Ambulation/Gait assistance: Supervision Ambulation Distance (Feet): 250 Feet Assistive device: Rolling walker (2 wheeled)       General Gait Details: improving postural extension; reciprocal stepping pattern.  Very slow and guarded, but improved from previous session.  Able to complete without standing rest break this date. Min assist from therapist for manual advancement of RW to increase gait speed.   Stairs Stairs: Yes Stairs assistance: Min assist Stair Management: One rail Right Number of Stairs: 6 General stair comments: step to gait pattern with R rail, L HHA; good knee control and stability.  Slow and guarded  Wheelchair Mobility    Modified Rankin (Stroke Patients Only)       Balance                                     Cognition                            Exercises Other Exercises Other Exercises: Toilet transfer x2, ambulatory with RW, mod indep. Able to negotiate surface changes, obstacles without LOB.  sit/stand from standard toilet height, mod indep with use of grab bar.  Standing balance for clothing management, mod indep. Other Exercises: Grooming and oral care at sink, standing with RW, mod indep.  Good standing tolerance; improving postural extension.  Limited functional reach due to abdominal pain.    General Comments        Pertinent Vitals/Pain Pain Score: 4  Pain Descriptors / Indicators: Aching;Sore Pain Intervention(s): Limited activity within patient's tolerance;Monitored during session;Repositioned    Home Living                      Prior Function            PT Goals (current goals can now be found in the care plan section) Acute Rehab PT Goals Patient Stated Goal: "to move around a little bit" PT Goal Formulation: With patient Time For Goal Achievement: 01/20/15 Potential to Achieve Goals: Good Progress towards PT goals: Progressing toward goals    Frequency  Min 2X/week    PT Plan Current plan remains appropriate    Co-evaluation  End of Session Equipment Utilized During Treatment: Gait belt Activity Tolerance: Patient tolerated treatment well Patient left:  (in [facility] with family planning to assist patient outside; RN informed/aware)     Time: 3016-0109 PT Time Calculation (min) (ACUTE ONLY): 30 min  Charges:  $Gait Training: 8-22 mins $Therapeutic Activity: 8-22 mins                    G Codes:      Dawaun Brancato H. Owens Shark, PT, DPT, NCS 01/08/2015, 4:32 PM [phone]

## 2015-01-08 NOTE — Progress Notes (Signed)
Surgery progress note  S: Some nausea overnight O: AF/VSS, good uop Gen; NAD/A&Ox3 ABD: soft, min tender, nondistended, incision c/d/i  A/P 54 yo s/p right hemicolectomy, doing well - likely home tomorrow

## 2015-01-09 MED ORDER — OXYCODONE-ACETAMINOPHEN 5-325 MG PO TABS
1.0000 | ORAL_TABLET | ORAL | Status: DC | PRN
Start: 2015-01-09 — End: 2015-01-09

## 2015-01-09 MED ORDER — OXYCODONE-ACETAMINOPHEN 5-325 MG PO TABS: 1.0000 | ORAL_TABLET | ORAL | Status: DC | PRN

## 2015-01-09 NOTE — Progress Notes (Signed)
Pt d/c to home today w/friend at bedside.  IV removed intact.  Pt d/c instructions reviewed and all questions and concerns addressed.  Rx printed and given to pt w/all questions and concerns addressed.  Medication Education printed on D/C paperwork.  Pt educated on prevention of infection and states understanding.  Volunteers called for transportation downstairs.

## 2015-01-09 NOTE — Progress Notes (Signed)
Nutrition Follow-up  INTERVENTION:   (Nutrition Supplement Therapy:) will continue Carnation Instant Breakfast as pt liked this am; pt prefers vanilla flavor Education:  RD provided "Low Fiber Nutrition Therapy" handout from the Academy of Nutrition and Dietetics. Discussed nutrition therapy to reduce the irritation of the gastrointestinal tract to promote healing. Provided list of recommended low fiberous foods in comparison to foods with high amounts of fiber, as well as a recommended sample day.  Expect good compliance.  NUTRITION DIAGNOSIS:  Inadequate oral intake related to altered GI function as evidenced by  (NPO/CL > 4 days), improved with diet advancement and tolerance  GOAL:  Patient will meet greater than or equal to 90% of their needs; ongoing  MONITOR:   (Energy intake, digestive system)  ASSESSMENT:  Pt to be discharged today. Pt continues on Regular diet with tolerance.  Diet Order: Regular   Current Nutrition: Pt reports eating breakfast this am and tolerating well. Pt reports drinking 100% of Carnation Instant Breakfast  Gastrointestinal Profile: Last BM: 6/23 this am  Medications: Protonix  Electrolyte/Renal Profile and Glucose Profile:   Recent Labs Lab 01/03/15 0433  NA 133*  K 3.6  CL 98*  CO2 27  BUN 9  CREATININE 0.53  CALCIUM 7.9*  GLUCOSE 104*    Weight Change: no new weight Filed Weights   12/28/14 2110  Weight: 118 lb (53.524 kg)   BMI:  Body mass index is 18.48 kg/(m^2).  Estimated Nutritional Needs:  Kcal:  BEE 1167 kcals (IF 1.1-1.2, aF 1.3) 2878-6767 kcals/d.   Protein:  (1.2-1.5 g/d) 67-65-81 g/d  Fluid:  (25-35ml/kg) 1350-1646ml/d  Skin:  Reviewed, no issues  Diet Order:  Diet regular Room service appropriate?: Yes; Fluid consistency:: Thin  EDUCATION NEEDS:  Education needs addressed   Intake/Output Summary (Last 24 hours) at 01/09/15 1219 Last data filed at 01/09/15 0944  Gross per 24 hour  Intake    776 ml   Output   1950 ml  Net  -1174 ml    MODERATE Care Level.  Dwyane Luo, [place], LDN Pager [phone]

## 2015-01-09 NOTE — Care Management (Signed)
Met with patient, her boyfriend Belva Crome which used to work in [place] at [employer], her brother Kerry Dory (patient will be going home with), and patient's mother. They agree to discharge to home today. I have updated [facility] of address and need to add RN for wound observation, pain management, and to monitor bowel. Rolling walker has been delivered to this room. Patient encouraged to ambulate to assist with nausea and bowels. She is tolerating diet well as she had just finished a bowl of food when I walked into the room. Message left with RN in [facility] room to please update MD.

## 2015-01-09 NOTE — Progress Notes (Signed)
Surgery progress note  S: no acute issues O: AF/VSS, good uop GEN: NAD/A&Ox3 ABD: soft, mild tender, nondistended, incision c/d/i  A/P 54 yo s/p ex lap, right hemicolectomy, doing well - discharge to home

## 2015-01-09 NOTE — Discharge Instructions (Signed)
Do not drive on pain medications Do not lift greater than 15 lbs for a period of 6 weeks Call or return to ER if you develop fever greater than 101.5, nausea/vomiting, increased pain, redness/drainage from incisions Okay to shower, no tub bathsSurgical Site Infections FAQs What is a Surgical Site Infection (SSI)? A surgical site infection is an infection that occurs after surgery in the part of the body where the surgery took place. Most patients who have surgery do not develop an infection. However, infections develop in about 1 to 3 out of every 100 patients who have surgery. Some of the common symptoms of a surgical site infection are:  Redness and pain around the area where you had surgery  Drainage of cloudy fluid from your surgical wound  Fever Can SSIs be treated? Yes. Most surgical site infections can be treated with antibiotics. The antibiotic given to you depends on the bacteria (germs) causing the infections. Sometimes patients with SSIs also need another surgery to treat the infection. What are some of the things that hospitals are doing to prevent SSIs? To prevent SSIs, doctors, nurses, and other healthcare providers:  Clean their hands and arms up to their elbows with an antiseptic agent just before the surgery.  Clean their hands with soap and water or an alcohol-based hand rub before and after caring for each patient.  May remove some of your hair immediately before your surgery using electric clippers if the hair is in the same area where the procedure will occur. They should not shave you with a razor.  Wear special hair covers, masks, gowns, and gloves during surgery to keep the surgery area clean.  Give you antibiotics before your surgery starts. In most cases, you should get antibiotics within 60 minutes before the surgery starts and the antibiotics should be stopped within 24 hours after surgery.  Clean the skin at the site of your surgery with a special soap that  kills germs. What can I do to help prevent SSIs? Before your surgery:  Tell your doctor about other medical problems you may have. Health problems such as allergies, diabetes, and obesity could affect your surgery and your treatment.  Quit smoking. Patients who smoke get more infections. Talk to your doctor about how you can quit before your surgery.  Do not shave near where you will have surgery. Shaving with a razor can irritate your skin and make it easier to develop an infection. At the time of your surgery:  Speak up if someone tries to shave you with a razor before surgery. Ask why you need to be shaved and talk with your surgeon if you have any concerns.  Ask if you will get antibiotics before surgery. After your surgery:  Make sure that your healthcare providers clean their hands before examining you, either with soap and water or an alcohol-based hand rub.  If you do not see your providers clean their hands, please ask them to do so.  Family and friends who visit you should not touch the surgical wound or dressings.  Family and friends should clean their hands with soap and water or an alcohol-based hand rub before and after visiting you. If you do not see them clean their hands, ask them to clean their hands. What do I need to do when I go home from the hospital?  Before you go home, your doctor or nurse should explain everything you need to know about taking care of your wound. Make sure you understand  how to care for your wound before you leave the hospital.  Always clean your hands before and after caring for your wound.  Before you go home, make sure you know who to contact if you have questions or problems after you get home.  If you have any symptoms of an infection, such as redness and pain at the surgery site, drainage, or fever, call your doctor immediately. If you have additional questions, please ask your doctor or nurse. Developed and co-sponsored by [employer]  for [place] [id number]); Infectious Diseases Society of [place] (IDSA); [place]; Association for Professionals in Infection Control and Epidemiology (APIC); Centers for Disease Control and Prevention (CDC); and [employer]. Document Released: 07/10/2013 Document Reviewed: 07/10/2013 [facility] Patient Information 2015 [place], [place]. This information is not intended to replace advice given to you by your health care provider. Make sure you discuss any questions you have with your health care provider.

## 2015-01-12 NOTE — Discharge Summary (Signed)
Discharge diagnosis: Cecal volvulus  Procedure performed: Right hemicolectomy    Medication List    STOP taking these medications        HYDROcodone-acetaminophen 5-325 MG per tablet  Commonly known as:  NORCO/VICODIN      TAKE these medications        ALPRAZolam 0.25 MG tablet  Commonly known as:  XANAX  Take 0.25 mg by mouth at bedtime as needed for anxiety.     diazepam 5 MG tablet  Commonly known as:  VALIUM  Take 1 tablet by mouth 3 (three) times daily as needed.     oxyCODONE-acetaminophen 5-325 MG per tablet  Commonly known as:  PERCOCET/ROXICET  Take 1-2 tablets by mouth every 4 (four) hours as needed for severe pain.       Indication for admission: Ms. Beil is a pleasant 54 yo M who presented with severe abdominal pain and radiographic evidence of cecal volvulus.  She was admitted for surgical management of cecal volvulus.  Hospital Course: Ms. Skerritt was admitted and underwent exploratory laparotomy with right hemicolectomy for cecal volvulus.  Postoperatively she was made NPO and given PCA for pain.  Slowly, her bowel function returned and her diet was advanced to clear liquid and eventually regular.  Her pain medication was advanced from IV to PO pain medication.  At time of discharge, she was tolerating a regular diet, having regular BM and her pain was controlled with oral narcotics.

## 2015-01-14 ENCOUNTER — Telehealth: Payer: Self-pay | Admitting: Surgery

## 2015-01-14 MED ORDER — OXYCODONE-ACETAMINOPHEN 5-325 MG PO TABS: 1.0000 | ORAL_TABLET | ORAL | Status: DC | PRN

## 2015-01-14 NOTE — Telephone Encounter (Signed)
Spoke with patient at this time and explained that prescription is ready in the [place] office.   She states that she will have her niece pick up the prescription.

## 2015-01-14 NOTE — Telephone Encounter (Signed)
Spoke with patient at this time. Triaged over the phone and denied fever, d/c at incision site. Having good BM's with use of stool softener.   Encouraged patient to ambulate as much as possible and also to increase water intake.  Call made to Dr. Marina Gravel and he will be over from hospital momentarily to fill-out prescription from patient.

## 2015-01-14 NOTE — Telephone Encounter (Signed)
Patient needs a refill on her pain medication - she had an exploratory laparotomy and right hemicolectomy on June 12th. Please call and advise, thanks.

## 2015-01-17 ENCOUNTER — Encounter: Payer: Self-pay | Admitting: Surgery

## 2015-01-17 ENCOUNTER — Ambulatory Visit (INDEPENDENT_AMBULATORY_CARE_PROVIDER_SITE_OTHER): Payer: 59 | Admitting: Surgery

## 2015-01-17 VITALS — BP 146/93 | HR 112 | Temp 97.3°F | Ht 64.0 in | Wt 113.4 lb

## 2015-01-17 DIAGNOSIS — Z09 Encounter for follow-up examination after completed treatment for conditions other than malignant neoplasm: Secondary | ICD-10-CM

## 2015-01-17 MED ORDER — IBUPROFEN 600 MG PO TABS: 600.0000 mg | ORAL_TABLET | Freq: Four times a day (QID) | ORAL | Status: DC | PRN

## 2015-01-17 MED ORDER — OXYCODONE-ACETAMINOPHEN 5-325 MG PO TABS: 1.0000 | ORAL_TABLET | ORAL | Status: DC | PRN

## 2015-01-17 NOTE — Patient Instructions (Signed)
Call us if you need [place] before your two weeks appointment.

## 2015-01-17 NOTE — Progress Notes (Signed)
Surgery Clinic Note  S: Doing well.  Sore.  Tolerating diet.  Having good BM.  Still requiring percocet. O:  Blood pressure 146/93, pulse 112, temperature 97.3 F (36.3 C), temperature source Oral, height 5\' 4"  (1.626 m), weight 113 lb 6.4 oz (51.438 kg). GEN: NAD/A&Ox3 ABD: soft, min tender, nondistended, incision c/d/i with staples  A/P Doing well.  Still with pain.  Tolerating diet with good BM.  Given rx for percocet and ibuprofen x 1 week.  F/u in 2 weeks.

## 2015-01-27 ENCOUNTER — Telehealth: Payer: Self-pay | Admitting: Surgery

## 2015-01-27 NOTE — Telephone Encounter (Signed)
Pt had a Explaratory Lap 6/11 and started feeling a burning sensation on her right side, Please call pt she just wanted to make sure it was normal, She has a PO visit On Wed 7/13

## 2015-01-27 NOTE — Telephone Encounter (Signed)
Returned patient call. Patient reports that she experienced severe right side pain on 01/25/15 that resolved. Patient also reported passing gas after the pain episode. No fever, swelling / bruising, or additional episodes of pain reported. Patient did have a normal BM this AM. Informed patient that she was probably experiencing gas pain. Patient directed to go to the ED if the sharp pain returns without resolution or she starts running a high fever. Patient confirmed understanding of information and direction.

## 2015-01-29 ENCOUNTER — Encounter: Payer: Self-pay | Admitting: Surgery

## 2015-01-29 ENCOUNTER — Ambulatory Visit (INDEPENDENT_AMBULATORY_CARE_PROVIDER_SITE_OTHER): Payer: 59 | Admitting: Surgery

## 2015-01-29 VITALS — BP 154/88 | HR 78 | Temp 97.9°F | Resp 20 | Ht 67.0 in | Wt 113.0 lb

## 2015-01-29 DIAGNOSIS — Z09 Encounter for follow-up examination after completed treatment for conditions other than malignant neoplasm: Secondary | ICD-10-CM

## 2015-01-29 MED ORDER — HYDROCODONE-ACETAMINOPHEN 5-325 MG PO TABS: 1.0000 | ORAL_TABLET | Freq: Four times a day (QID) | ORAL | Status: DC | PRN

## 2015-01-29 MED ORDER — DIAZEPAM 5 MG PO TABS: 5.0000 mg | ORAL_TABLET | Freq: Two times a day (BID) | ORAL | Status: DC | PRN

## 2015-01-29 NOTE — Progress Notes (Signed)
Surgery Office Note  S: Episode of acute onset RLQ pain, diarrhea. Now resolved.  C/o muscle spasm intermittently RLQ.  Still requiring intermittent percocet and valium Blood pressure 154/88, pulse 78, temperature 97.9 F (36.6 C), temperature source Oral, resp. rate 20, height 5\' 7"  (1.702 m), weight 51.256 kg (113 lb). GEN: NAD/A&Ox3 ABD: soft, min tender, nondistended, incision c/d/i  A/P 54 yo s/p right hemicolectomy for cecal volvulus, doing well - refilled narcotic with norco, refilled valium for muscle spasm - f/u in 1 month to ensure resolution

## 2015-01-29 NOTE — Patient Instructions (Signed)
Do not drive on pain medications Do not lift greater than 15 lbs for a period of 6 weeks Call or return to ER if you develop fever greater than 101.5, nausea/vomiting, increased pain, redness/drainage from incisions

## 2015-02-14 ENCOUNTER — Telehealth: Payer: Self-pay | Admitting: Surgery

## 2015-02-14 NOTE — Telephone Encounter (Signed)
Pain under incision rating 8/10 in the am for the last week. Pt states that she has just noticed this pain, states that this is a different pain from right after surgery.   Pt states that she is also having some swelling in abdomen intermittently. Nothing make this worse or causing this.  Denies Nausea, vomiting, diarrhea, constipation, or fever. Bowel movement are normal schedule and are soft per pt.  Moved patient to 02/19/15 with Dr. Rexene Edison for evaluation.   Encouraged to call our office with severe continuous pain, fever, and Nausea or Vomiting. If office is closed, number given to main switchboard for surgeon on call.

## 2015-02-14 NOTE — Telephone Encounter (Signed)
Please call patient about getting a refill on her pain medication - Hydrocodone. She has 2 left and her next appointment with Dr Rexene Edison isn't until August 15th. Thanks.

## 2015-02-19 ENCOUNTER — Emergency Department: Payer: 59

## 2015-02-19 ENCOUNTER — Telehealth: Payer: Self-pay

## 2015-02-19 ENCOUNTER — Other Ambulatory Visit
Admission: RE | Admit: 2015-02-19 | Discharge: 2015-02-19 | Disposition: A | Discharge status: Home / Self Care | Payer: 59 | Source: Ambulatory Visit | Attending: Surgery | Admitting: Surgery

## 2015-02-19 ENCOUNTER — Encounter: Payer: Self-pay | Admitting: Surgery

## 2015-02-19 ENCOUNTER — Ambulatory Visit (INDEPENDENT_AMBULATORY_CARE_PROVIDER_SITE_OTHER): Payer: 59 | Admitting: Surgery

## 2015-02-19 ENCOUNTER — Emergency Department
Admission: EM | Admit: 2015-02-19 | Discharge: 2015-02-19 | Disposition: A | Discharge status: Home / Self Care | Payer: 59 | Attending: Emergency Medicine | Admitting: Emergency Medicine

## 2015-02-19 VITALS — BP 149/90 | HR 96 | Temp 98.1°F | Wt 112.6 lb

## 2015-02-19 DIAGNOSIS — F419 Anxiety disorder, unspecified: Secondary | ICD-10-CM | POA: Insufficient documentation

## 2015-02-19 DIAGNOSIS — R109 Unspecified abdominal pain: Secondary | ICD-10-CM | POA: Insufficient documentation

## 2015-02-19 DIAGNOSIS — Z791 Long term (current) use of non-steroidal anti-inflammatories (NSAID): Secondary | ICD-10-CM | POA: Diagnosis not present

## 2015-02-19 DIAGNOSIS — R1031 Right lower quadrant pain: Secondary | ICD-10-CM

## 2015-02-19 DIAGNOSIS — G8929 Other chronic pain: Secondary | ICD-10-CM

## 2015-02-19 LAB — CBC WITH DIFFERENTIAL/PLATELET
Basophils Absolute: 0.1 10*3/uL (ref 0–0.1)
Basophils Relative: 1 %
EOS ABS: 0.2 10*3/uL (ref 0–0.7)
Eosinophils Relative: 2 %
HCT: 34.9 % — ABNORMAL LOW (ref 35.0–47.0)
Hemoglobin: 11.5 g/dL — ABNORMAL LOW (ref 12.0–16.0)
Lymphocytes Relative: 19 %
Lymphs Abs: 2.1 10*3/uL (ref 1.0–3.6)
MCH: 27.7 pg (ref 26.0–34.0)
MCHC: 33.1 g/dL (ref 32.0–36.0)
MCV: 83.9 fL (ref 80.0–100.0)
Monocytes Absolute: 0.8 10*3/uL (ref 0.2–0.9)
Monocytes Relative: 7 %
NEUTROS ABS: 7.9 10*3/uL — AB (ref 1.4–6.5)
Neutrophils Relative %: 71 %
Platelets: 508 10*3/uL — ABNORMAL HIGH (ref 150–440)
RBC: 4.16 MIL/uL (ref 3.80–5.20)
RDW: 13.6 % (ref 11.5–14.5)
WBC: 11 10*3/uL (ref 3.6–11.0)

## 2015-02-19 LAB — URINALYSIS COMPLETE WITH MICROSCOPIC (ARMC ONLY)
BACTERIA UA: NONE SEEN
Bilirubin Urine: NEGATIVE
Glucose, UA: NEGATIVE mg/dL
HGB URINE DIPSTICK: NEGATIVE
Ketones, ur: NEGATIVE mg/dL
Leukocytes, UA: NEGATIVE
NITRITE: NEGATIVE
Protein, ur: NEGATIVE mg/dL
Specific Gravity, Urine: 1.004 — ABNORMAL LOW (ref 1.005–1.030)
pH: 8 (ref 5.0–8.0)

## 2015-02-19 LAB — COMPREHENSIVE METABOLIC PANEL
ALK PHOS: 57 U/L (ref 38–126)
ALT: 15 U/L (ref 14–54)
ANION GAP: 11 (ref 5–15)
AST: 26 U/L (ref 15–41)
Albumin: 4.6 g/dL (ref 3.5–5.0)
BUN: 6 mg/dL (ref 6–20)
CHLORIDE: 96 mmol/L — AB (ref 101–111)
CO2: 25 mmol/L (ref 22–32)
CREATININE: 0.62 mg/dL (ref 0.44–1.00)
Calcium: 9.6 mg/dL (ref 8.9–10.3)
GFR calc Af Amer: >60 mL/min (ref >60)
GFR calc non Af Amer: >60 mL/min (ref >60)
Glucose, Bld: 96 mg/dL (ref 65–99)
Potassium: 3.9 mmol/L (ref 3.5–5.1)
Sodium: 132 mmol/L — ABNORMAL LOW (ref 135–145)
TOTAL PROTEIN: 8.1 g/dL (ref 6.5–8.1)
Total Bilirubin: 0.7 mg/dL (ref 0.3–1.2)

## 2015-02-19 MED ORDER — SUCRALFATE 1 G PO TABS: 1.0000 g | ORAL_TABLET | Freq: Four times a day (QID) | ORAL | Status: DC

## 2015-02-19 MED ORDER — IOHEXOL 300 MG/ML  SOLN
100.0000 mL | Freq: Once | INTRAMUSCULAR | Status: AC | PRN
  Administered 2015-02-19: 85 mL via INTRAVENOUS
  Filled 2015-02-19: qty 100

## 2015-02-19 MED ORDER — IOHEXOL 240 MG/ML SOLN
50.0000 mL | INTRAMUSCULAR | Status: AC
  Administered 2015-02-19: 50 mL via ORAL
  Filled 2015-02-19 (×2): qty 50

## 2015-02-19 MED ORDER — OXYCODONE-ACETAMINOPHEN 5-325 MG PO TABS: 1.0000 | ORAL_TABLET | Freq: Four times a day (QID) | ORAL | Status: DC | PRN

## 2015-02-19 MED ORDER — PANTOPRAZOLE SODIUM 40 MG PO TBEC: 40.0000 mg | DELAYED_RELEASE_TABLET | Freq: Every day | ORAL | Status: DC

## 2015-02-19 MED ORDER — IBUPROFEN 600 MG PO TABS
600.0000 mg | ORAL_TABLET | Freq: Three times a day (TID) | ORAL | Status: AC
Start: 2015-02-19 — End: 2015-02-25

## 2015-02-19 NOTE — Discharge Instructions (Signed)
Please seek medical attention for any high fevers, chest pain, shortness of breath, change in behavior, persistent vomiting, bloody stool or any other new or concerning symptoms.  Abdominal Pain, Women Abdominal (stomach, pelvic, or belly) pain can be caused by many things. It is important to tell your doctor:  The location of the pain.  Does it come and go or is it present all the time?  Are there things that start the pain (eating certain foods, exercise)?  Are there other symptoms associated with the pain (fever, nausea, vomiting, diarrhea)? All of this is helpful to know when trying to find the cause of the pain. CAUSES   Stomach: virus or bacteria infection, or ulcer.  Intestine: appendicitis (inflamed appendix), regional ileitis (Crohn's disease), ulcerative colitis (inflamed colon), irritable bowel syndrome, diverticulitis (inflamed diverticulum of the colon), or cancer of the stomach or intestine.  Gallbladder disease or stones in the gallbladder.  Kidney disease, kidney stones, or infection.  Pancreas infection or cancer.  Fibromyalgia (pain disorder).  Diseases of the female organs:  Uterus: fibroid (non-cancerous) tumors or infection.  Fallopian tubes: infection or tubal pregnancy.  Ovary: cysts or tumors.  Pelvic adhesions (scar tissue).  Endometriosis (uterus lining tissue growing in the pelvis and on the pelvic organs).  Pelvic congestion syndrome (female organs filling up with blood just before the menstrual period).  Pain with the menstrual period.  Pain with ovulation (producing an egg).  Pain with an IUD (intrauterine device, birth control) in the uterus.  Cancer of the female organs.  Functional pain (pain not caused by a disease, may improve without treatment).  Psychological pain.  Depression. DIAGNOSIS  Your doctor will decide the seriousness of your pain by doing an examination.  Blood tests.  X-rays.  Ultrasound.  CT scan  (computed tomography, special type of X-ray).  MRI (magnetic resonance imaging).  Cultures, for infection.  Barium enema (dye inserted in the large intestine, to better view it with X-rays).  Colonoscopy (looking in intestine with a lighted tube).  Laparoscopy (minor surgery, looking in abdomen with a lighted tube).  Major abdominal exploratory surgery (looking in abdomen with a large incision). TREATMENT  The treatment will depend on the cause of the pain.   Many cases can be observed and treated at home.  Over-the-counter medicines recommended by your caregiver.  Prescription medicine.  Antibiotics, for infection.  Birth control pills, for painful periods or for ovulation pain.  Hormone treatment, for endometriosis.  Nerve blocking injections.  Physical therapy.  Antidepressants.  Counseling with a psychologist or psychiatrist.  Minor or major surgery. HOME CARE INSTRUCTIONS   Do not take laxatives, unless directed by your caregiver.  Take over-the-counter pain medicine only if ordered by your caregiver. Do not take aspirin because it can cause an upset stomach or bleeding.  Try a clear liquid diet (broth or water) as ordered by your caregiver. Slowly move to a bland diet, as tolerated, if the pain is related to the stomach or intestine.  Have a thermometer and take your temperature several times a day, and record it.  Bed rest and sleep, if it helps the pain.  Avoid sexual intercourse, if it causes pain.  Avoid stressful situations.  Keep your follow-up appointments and tests, as your caregiver orders.  If the pain does not go away with medicine or surgery, you may try:  Acupuncture.  Relaxation exercises (yoga, meditation).  Group therapy.  Counseling. SEEK MEDICAL CARE IF:   You notice certain foods cause stomach  pain.  Your home care treatment is not helping your pain.  You need stronger pain medicine.  You want your IUD removed.  You  feel faint or lightheaded.  You develop nausea and vomiting.  You develop a rash.  You are having side effects or an allergy to your medicine. SEEK IMMEDIATE MEDICAL CARE IF:   Your pain does not go away or gets worse.  You have a fever.  Your pain is felt only in portions of the abdomen. The right side could possibly be appendicitis. The left lower portion of the abdomen could be colitis or diverticulitis.  You are passing blood in your stools (bright red or black tarry stools, with or without vomiting).  You have blood in your urine.  You develop chills, with or without a fever.  You pass out. MAKE SURE YOU:   Understand these instructions.  Will watch your condition.  Will get help right away if you are not doing well or get worse. Document Released: 05/02/2007 Document Revised: 11/19/2013 Document Reviewed: 05/22/2009 [facility] Patient Information 2015 [place], [place]. This information is not intended to replace advice given to you by your health care provider. Make sure you discuss any questions you have with your health care provider.

## 2015-02-19 NOTE — Progress Notes (Signed)
CC: Right back pain radiating to groin  HPI: Sarah Clarke is a pleasant 54 yo F who presents with 1 week of right sided back pain radiating to groin.  Says that it hurts less with movement.  Has had pain similar to this long ago and she saw a chiropractor for this without significant improvement.  Normal BM.  Different than with pain with cecal volvulus.  No improvement with valium, has not taken NSAIDs.  Otherwise doing well.  No fevers/chills, night sweats, shortness of breath, cough, chest pain, nausea/vomiting, diarrhea/constipation.    Blood pressure 149/90, pulse 96, temperature 98.1 F (36.7 C), temperature source Oral, weight 51.075 kg (112 lb 9.6 oz). GEN: NAD, tearful BACK: No cva tenderness ABD: no obvious hernias, hyperesthetic exam, worse suprapubic right side > left side and > right upper quadrant  Labs: No new labs Imaging: No new imaging  A/P 54 yo with new onset abdominal/back pain.  I suspect that her pain is musculoskeletal vs neuropathic.  Neuropathic particularly that it gets worse with resting.  Cannot rule out UTI with new onset suprapubic pain so will obtain UA.  Will obtain labs.  Will begin on NSAIDs for possible musculoskeletal pain and hydrocodone for breakthrough.  Will f/u labs.  No acute surgical needs.

## 2015-02-19 NOTE — ED Notes (Signed)
NAD noted at time of D/C. Pt denies comments/concerns at this time. Pt taken to lobby via wheelchair.

## 2015-02-19 NOTE — Patient Instructions (Addendum)
You will need to have labs and a urine sample collected at the hospital right after your appointment today. We will call you with results.  Take your prescriptions to the pharmacy.  Depending on your results, we may need to follow-up.

## 2015-02-19 NOTE — ED Notes (Signed)
Pt reports to ED w/ c/o R side flank pain.  Pt sts she was directed here from MD [place] she had intestinal surgery 6 wks ago.

## 2015-02-19 NOTE — ED Provider Notes (Signed)
[facility] Emergency Department Provider Note   ____________________________________________  Time seen: 1450  I have reviewed the triage vital signs and the nursing notes.   HISTORY  Chief Complaint Flank Pain   History limited by: Not Limited   HPI Sarah Clarke is a 54 y.o. female who presents to the emergency department today because of right sided flank pain. It has been going on for 1 week. It is intermittent. She describes it as sharp. It radiates into the groin. She states that it worse at night and in the mornings. She has not had any associated nausea. She states that she feels like she gets some relief after urination. She denies any fevers. She did undergo surgery for cecal wall was roughly 2 months ago. Had outpatient blood work done today.  Past Medical History  Diagnosis Date  . Arthritis   . Anxiety     Patient Active Problem List   Diagnosis Date Noted  . Adjustment disorder with mixed anxiety and depressed mood 01/03/2015  . Cecal volvulus 12/29/2014    Past Surgical History  Procedure Laterality Date  . Neck fusion    . Abdominal hysterectomy      Partial  . Laparotomy N/A 12/29/2014    Procedure: EXPLORATORY LAPAROTOMY with right hemicolectomy ;  Surgeon: Marlyce Huge, MD;  Location: [facility] ORS;  Service: General;  Laterality: N/A;  . Colon surgery      Current Outpatient Rx  Name  Route  Sig  Dispense  Refill  . ALPRAZolam (XANAX) 0.25 MG tablet   Oral   Take 0.25 mg by mouth at bedtime as needed for anxiety.         . diazepam (VALIUM) 5 MG tablet   Oral   Take 1 tablet (5 mg total) by mouth every 12 (twelve) hours as needed.   30 tablet   0   . ibuprofen (ADVIL,MOTRIN) 600 MG tablet   Oral   Take 1 tablet (600 mg total) by mouth 3 (three) times daily.   21 tablet   0   . oxyCODONE-acetaminophen (ROXICET) 5-325 MG per tablet   Oral   Take 1 tablet by mouth every 6 (six) hours as needed for severe  pain.   25 tablet   0     Allergies Codeine and Morphine and related  Family History  Problem Relation Age of Onset  . Diabetes Mother   . Hypertension Father     Social History History  Substance Use Topics  . Smoking status: Never Smoker   . Smokeless tobacco: Never Used  . Alcohol Use: 14.4 oz/week    24 Cans of beer per week    Review of Systems  Constitutional: Negative for fever. Cardiovascular: Negative for chest pain. Respiratory: Negative for shortness of breath. Gastrointestinal: Right flank pain Genitourinary: Negative for dysuria. Musculoskeletal: Negative for back pain. Skin: Negative for rash. Neurological: Negative for headaches, focal weakness or numbness.  10-point ROS otherwise negative.  ____________________________________________   PHYSICAL EXAM:  VITAL SIGNS: ED Triage Vitals  Enc Vitals Group     BP 02/19/15 1342 148/78 mmHg     Pulse Rate 02/19/15 1342 83     Resp 02/19/15 1342 14     Temp 02/19/15 1342 97.5 F (36.4 C)     Temp Source 02/19/15 1342 Oral     SpO2 02/19/15 1342 99 %     Weight 02/19/15 1342 112 lb (50.803 kg)     Height 02/19/15 1342 5'  7" (1.702 m)     Head Cir --      Peak Flow --      Pain Score 02/19/15 1342 8   Constitutional: Alert and oriented. Well appearing and in no distress. Eyes: Conjunctivae are normal. PERRL. Normal extraocular movements. ENT   Head: Normocephalic and atraumatic.   Nose: No congestion/rhinnorhea.   Mouth/Throat: Mucous membranes are moist.   Neck: No stridor. Hematological/Lymphatic/Immunilogical: No cervical lymphadenopathy. Cardiovascular: Normal rate, regular rhythm.  No murmurs, rubs, or gallops. Respiratory: Normal respiratory effort without tachypnea nor retractions. Breath sounds are clear and equal bilaterally. No wheezes/rales/rhonchi. Gastrointestinal: Right lower quadrant tenderness. No rebound. No guarding. Genitourinary: Deferred Musculoskeletal: Normal  range of motion in all extremities. No joint effusions.  No lower extremity tenderness nor edema. Neurologic:  Normal speech and language. No gross focal neurologic deficits are appreciated. Speech is normal.  Skin:  Skin is warm, dry and intact. No rash noted. Psychiatric: Mood and affect are normal. Speech and behavior are normal. Patient exhibits appropriate insight and judgment.  ____________________________________________    LABS (pertinent positives/negatives)  Labs drawn in outpatient laboratory from earlier this afternoon reviewed. No leukocytosis.   ____________________________________________   EKG  None  ____________________________________________    RADIOLOGY  CT abd/ped IMPRESSION: Interval surgical repair of cecal volvulus with expected postsurgical changes.  Distal colonic diverticulosis without evidence of diverticulitis.  No definite acute intra-abdominal or intrapelvic abnormalities identified.  ____________________________________________   PROCEDURES  Procedure(s) performed: None  Critical Care performed: No  ____________________________________________   INITIAL IMPRESSION / ASSESSMENT AND PLAN / ED COURSE  Pertinent labs & imaging results that were available during my care of the patient were reviewed by me and considered in my medical decision making (see chart for details).  Patient presents to the emergency department today because of concerns for abdominal pain. CT abdomen and pelvis without obvious cause of pain. Did discuss with patient. Did encourage primary care and surgical follow-up.  ____________________________________________   FINAL CLINICAL IMPRESSION(S) / ED DIAGNOSES  Final diagnoses:  Abdominal pain, unspecified abdominal location     Nance Pear, MD 02/19/15 5643

## 2015-02-19 NOTE — ED Notes (Signed)
Pt here from outpatient lab. Was getting labs drawn and started having abdominal pain.

## 2015-02-19 NOTE — Telephone Encounter (Signed)
Reviewed all lab and urine results.   Spoke with Dr. Rexene Edison to review labs, he would like to continue NSAIDS as planned x 1 week. If pain persists, will obtain Abdominal/Pelvis CT with contrast. If pain subsides or CT normal, refer back to PCP.  Called patient at this time. No answer. Left Voicemail for return phone call.

## 2015-02-20 NOTE — Telephone Encounter (Signed)
Pt seen in ED on 02/19/15 in evening. CT completed and was WNL. Dr. Rexene Edison made aware.

## 2015-03-03 ENCOUNTER — Ambulatory Visit: Payer: Self-pay | Admitting: Surgery

## 2015-03-03 ENCOUNTER — Ambulatory Visit (INDEPENDENT_AMBULATORY_CARE_PROVIDER_SITE_OTHER): Payer: 59 | Admitting: Surgery

## 2015-03-03 ENCOUNTER — Encounter: Payer: Self-pay | Admitting: Surgery

## 2015-03-03 VITALS — BP 119/64 | HR 91 | Temp 97.8°F | Ht 67.0 in | Wt 114.0 lb

## 2015-03-03 DIAGNOSIS — Z09 Encounter for follow-up examination after completed treatment for conditions other than malignant neoplasm: Secondary | ICD-10-CM

## 2015-03-03 NOTE — Progress Notes (Signed)
Surgery Progress Note  S: No acute issues.  Some headaches with remeron.  Pain present at prior visit has resolved.   O:Blood pressure 119/64, pulse 91, temperature 97.8 F (36.6 C), height 5\' 7"  (1.702 m), weight 114 lb (51.71 kg).  GEN: NAD/A&Ox3 ABD: soft, min tender, nondistended, incision c/d/i  A/P 54 yo s/p right hemicolectomy for volvulus, doing well - f/u prn.

## 2015-03-03 NOTE — Patient Instructions (Signed)
Please give us a call if you have any questions or concerns. 

## 2016-08-10 ENCOUNTER — Ambulatory Visit
Admission: RE | Admit: 2016-08-10 | Discharge: 2016-08-10 | Disposition: A | Discharge status: Home / Self Care | Payer: BLUE CROSS/BLUE SHIELD | Source: Ambulatory Visit | Attending: Nurse Practitioner | Admitting: Nurse Practitioner

## 2016-08-10 ENCOUNTER — Other Ambulatory Visit: Payer: Self-pay | Admitting: Nurse Practitioner

## 2016-08-10 DIAGNOSIS — R05 Cough: Secondary | ICD-10-CM

## 2016-08-10 DIAGNOSIS — R059 Cough, unspecified: Secondary | ICD-10-CM

## 2016-08-10 DIAGNOSIS — R918 Other nonspecific abnormal finding of lung field: Secondary | ICD-10-CM | POA: Diagnosis not present

## 2016-08-16 ENCOUNTER — Other Ambulatory Visit: Payer: Self-pay | Admitting: Student

## 2016-08-16 DIAGNOSIS — S62002A Unspecified fracture of navicular [scaphoid] bone of left wrist, initial encounter for closed fracture: Secondary | ICD-10-CM

## 2016-08-17 ENCOUNTER — Ambulatory Visit: Payer: BLUE CROSS/BLUE SHIELD

## 2016-08-18 ENCOUNTER — Ambulatory Visit
Admission: RE | Admit: 2016-08-18 | Discharge: 2016-08-18 | Disposition: A | Discharge status: Home / Self Care | Payer: BLUE CROSS/BLUE SHIELD | Source: Ambulatory Visit | Attending: Student | Admitting: Student

## 2016-08-18 DIAGNOSIS — M67432 Ganglion, left wrist: Secondary | ICD-10-CM | POA: Diagnosis not present

## 2016-08-18 DIAGNOSIS — M85442 Solitary bone cyst, left hand: Secondary | ICD-10-CM | POA: Insufficient documentation

## 2016-08-18 DIAGNOSIS — S62002A Unspecified fracture of navicular [scaphoid] bone of left wrist, initial encounter for closed fracture: Secondary | ICD-10-CM | POA: Diagnosis not present

## 2016-08-18 DIAGNOSIS — R609 Edema, unspecified: Secondary | ICD-10-CM | POA: Diagnosis not present

## 2016-08-18 DIAGNOSIS — X58XXXA Exposure to other specified factors, initial encounter: Secondary | ICD-10-CM | POA: Insufficient documentation

## 2016-08-29 ENCOUNTER — Emergency Department: Payer: BLUE CROSS/BLUE SHIELD

## 2016-08-29 ENCOUNTER — Encounter: Payer: Self-pay | Admitting: Radiology

## 2016-08-29 ENCOUNTER — Emergency Department
Admission: EM | Admit: 2016-08-29 | Discharge: 2016-08-29 | Disposition: A | Discharge status: Home / Self Care | Payer: BLUE CROSS/BLUE SHIELD | Attending: Emergency Medicine | Admitting: Emergency Medicine

## 2016-08-29 DIAGNOSIS — Z79899 Other long term (current) drug therapy: Secondary | ICD-10-CM | POA: Diagnosis not present

## 2016-08-29 DIAGNOSIS — R51 Headache: Secondary | ICD-10-CM | POA: Diagnosis present

## 2016-08-29 DIAGNOSIS — R0602 Shortness of breath: Secondary | ICD-10-CM | POA: Insufficient documentation

## 2016-08-29 DIAGNOSIS — J101 Influenza due to other identified influenza virus with other respiratory manifestations: Secondary | ICD-10-CM | POA: Diagnosis not present

## 2016-08-29 LAB — URINALYSIS, COMPLETE (UACMP) WITH MICROSCOPIC
Bacteria, UA: NONE SEEN
Bilirubin Urine: NEGATIVE
Glucose, UA: NEGATIVE mg/dL
Hgb urine dipstick: NEGATIVE
Ketones, ur: NEGATIVE mg/dL
Leukocytes, UA: NEGATIVE
Nitrite: NEGATIVE
PH: 7 (ref 5.0–8.0)
Protein, ur: NEGATIVE mg/dL
RBC / HPF: NONE SEEN RBC/hpf (ref 0–5)
SPECIFIC GRAVITY, URINE: 1.002 — AB (ref 1.005–1.030)
WBC, UA: NONE SEEN WBC/hpf (ref 0–5)

## 2016-08-29 LAB — CBC
HCT: 40 % (ref 35.0–47.0)
HEMOGLOBIN: 13.8 g/dL (ref 12.0–16.0)
MCH: 29.5 pg (ref 26.0–34.0)
MCHC: 34.4 g/dL (ref 32.0–36.0)
MCV: 85.9 fL (ref 80.0–100.0)
Platelets: 213 10*3/uL (ref 150–440)
RBC: 4.66 MIL/uL (ref 3.80–5.20)
RDW: 13.2 % (ref 11.5–14.5)
WBC: 2.6 10*3/uL — ABNORMAL LOW (ref 3.6–11.0)

## 2016-08-29 LAB — COMPREHENSIVE METABOLIC PANEL
ALK PHOS: 51 U/L (ref 38–126)
ALT: 15 U/L (ref 14–54)
AST: 35 U/L (ref 15–41)
Albumin: 4.2 g/dL (ref 3.5–5.0)
Anion gap: 9 (ref 5–15)
BUN: 9 mg/dL (ref 6–20)
CALCIUM: 9 mg/dL (ref 8.9–10.3)
CO2: 24 mmol/L (ref 22–32)
Chloride: 95 mmol/L — ABNORMAL LOW (ref 101–111)
Creatinine, Ser: 0.8 mg/dL (ref 0.44–1.00)
GFR calc non Af Amer: >60 mL/min (ref >60)
Glucose, Bld: 116 mg/dL — ABNORMAL HIGH (ref 65–99)
Potassium: 3.9 mmol/L (ref 3.5–5.1)
Sodium: 128 mmol/L — ABNORMAL LOW (ref 135–145)
TOTAL PROTEIN: 7.8 g/dL (ref 6.5–8.1)
Total Bilirubin: 0.4 mg/dL (ref 0.3–1.2)

## 2016-08-29 LAB — DIFFERENTIAL
BASOS PCT: 0 %
Basophils Absolute: 0 10*3/uL (ref 0–0.1)
EOS ABS: 0 10*3/uL (ref 0–0.7)
EOS PCT: 0 %
LYMPHS ABS: 0.7 10*3/uL — AB (ref 1.0–3.6)
Lymphocytes Relative: 26 %
MONOS PCT: 14 %
Monocytes Absolute: 0.4 10*3/uL (ref 0.2–0.9)
NEUTROS PCT: 60 %
Neutro Abs: 1.6 10*3/uL (ref 1.4–6.5)

## 2016-08-29 LAB — INFLUENZA PANEL BY PCR (TYPE A & B)
INFLBPCR: POSITIVE — AB
Influenza A By PCR: NEGATIVE

## 2016-08-29 LAB — RAPID HIV SCREEN (HIV 1/2 AB+AG)
HIV 1/2 ANTIBODIES: NONREACTIVE
HIV-1 P24 ANTIGEN - HIV24: NONREACTIVE

## 2016-08-29 LAB — TROPONIN I: Troponin I: <0.03 ng/mL (ref <0.03)

## 2016-08-29 MED ORDER — IOPAMIDOL (ISOVUE-370) INJECTION 76%
75.0000 mL | Freq: Once | INTRAVENOUS | Status: AC | PRN
  Administered 2016-08-29: 75 mL via INTRAVENOUS

## 2016-08-29 MED ORDER — HYDROCOD POLST-CPM POLST ER 10-8 MG/5ML PO SUER: 5.0000 mL | Freq: Two times a day (BID) | ORAL | 0 refills | Status: DC

## 2016-08-29 MED ORDER — IPRATROPIUM-ALBUTEROL 0.5-2.5 (3) MG/3ML IN SOLN
3.0000 mL | Freq: Once | RESPIRATORY_TRACT | Status: AC
  Administered 2016-08-29: 3 mL via RESPIRATORY_TRACT
  Filled 2016-08-29: qty 3

## 2016-08-29 MED ORDER — SODIUM CHLORIDE 0.9 % IV SOLN
1000.0000 mL | Freq: Once | INTRAVENOUS | Status: AC
  Administered 2016-08-29: 1000 mL via INTRAVENOUS

## 2016-08-29 MED ORDER — LORAZEPAM 2 MG/ML IJ SOLN
0.5000 mg | Freq: Once | INTRAMUSCULAR | Status: AC
  Administered 2016-08-29: 0.5 mg via INTRAVENOUS
  Filled 2016-08-29: qty 1

## 2016-08-29 MED ORDER — METOCLOPRAMIDE HCL 5 MG/ML IJ SOLN
10.0000 mg | Freq: Once | INTRAMUSCULAR | Status: AC
  Administered 2016-08-29: 10 mg via INTRAVENOUS
  Filled 2016-08-29: qty 2

## 2016-08-29 NOTE — ED Notes (Signed)
MD at bedside. 

## 2016-08-29 NOTE — ED Triage Notes (Signed)
Pt states that she is on her 3rd round of antibiotics, pt states that she is currently taking tamiflu, prednisone, and levaquin, pt states that she feels worse, pt states that she started with a headache on Wednesday and hasn't stopped, pt reports feeling weak, pt is tearful, states with her initial diagnosis of flu she passed out and fx'd her left wrist

## 2016-08-29 NOTE — ED Provider Notes (Signed)
[facility] Emergency Department Provider Note        Time seen: ----------------------------------------- 5:00 PM on 08/29/2016 -----------------------------------------    I have reviewed the triage vital signs and the nursing notes.   HISTORY  Chief Complaint Headache    HPI Sarah Clarke is a 56 y.o. female who presents to ER for persistent shortness of breath and cough. Patient states she's finished 3 rounds of antibiotics and she is currently taking Tamiflu, prednisone and Levaquin. Patient reports she feels worse. She's had a headache that has been severe since Wednesday. She feels weak and is tearful. Patient states her initial diagnosis was influenza. She had passed out a month ago and injured her left wrist.   Past Medical History:  Diagnosis Date  . Anxiety   . Arthritis     Patient Active Problem List   Diagnosis Date Noted  . Adjustment disorder with mixed anxiety and depressed mood 01/03/2015  . Cecal volvulus ([place]) 12/29/2014    Past Surgical History:  Procedure Laterality Date  . ABDOMINAL HYSTERECTOMY     Partial  . COLON SURGERY    . LAPAROTOMY N/A 12/29/2014   Procedure: EXPLORATORY LAPAROTOMY with right hemicolectomy ;  Surgeon: Marlyce Huge, MD;  Location: [facility] ORS;  Service: General;  Laterality: N/A;  . neck fusion      Allergies Codeine and Morphine and related  Social History Social History  Substance Use Topics  . Smoking status: Never Smoker  . Smokeless tobacco: Never Used  . Alcohol use 14.4 oz/week    24 Cans of beer per week    Review of Systems Constitutional: Positive for fever Cardiovascular: Negative for chest pain. Respiratory: Positive for shortness of breath, cough Gastrointestinal: Negative for abdominal pain, vomiting and diarrhea. Genitourinary: Negative for dysuria. Musculoskeletal: Negative for back pain. Skin: Negative for rash. Neurological: Negative for headaches, positive  for generalized weakness  10-point ROS otherwise negative.  ____________________________________________   PHYSICAL EXAM:  VITAL SIGNS: ED Triage Vitals  Enc Vitals Group     BP 08/29/16 1602 140/71     Pulse Rate 08/29/16 1602 86     Resp 08/29/16 1602 18     Temp 08/29/16 1602 98.9 F (37.2 C)     Temp Source 08/29/16 1602 Oral     SpO2 08/29/16 1602 100 %     Weight 08/29/16 1603 120 lb (54.4 kg)     Height 08/29/16 1603 5\' 7"  (1.702 m)     Head Circumference --      Peak Flow --      Pain Score 08/29/16 1603 8     Pain Loc --      Pain Edu? --      Excl. in [place]? --     Constitutional: Alert and oriented. Anxious, no distress Eyes: Conjunctivae are normal. PERRL. Normal extraocular movements. ENT   Head: Normocephalic and atraumatic.   Nose: No congestion/rhinnorhea.   Mouth/Throat: Mucous membranes are moist.   Neck: No stridor. Cardiovascular: Normal rate, regular rhythm. No murmurs, rubs, or gallops. Respiratory: Normal respiratory effort without tachypnea nor retractions. Breath sounds are clear and equal bilaterally. No wheezes/rales/rhonchi. Gastrointestinal: Soft and nontender. Normal bowel sounds Musculoskeletal: Nontender with normal range of motion in all extremities. No lower extremity tenderness nor edema. Neurologic:  Normal speech and language. No gross focal neurologic deficits are appreciated.  Skin:  Skin is warm, dry and intact. No rash noted. Psychiatric: Depressed mood and affect ____________________________________________  ED COURSE:  Pertinent labs & imaging results that were available during my care of the patient were reviewed by me and considered in my medical decision making (see chart for details). Patient presents to ER with persistent cough and shortness of breath and weakness. We will assess with labs and imaging.   Procedures ____________________________________________   LABS (pertinent positives/negatives)  Labs  Reviewed  CBC - Abnormal; Notable for the following:       Result Value   WBC 2.6 (*)    All other components within normal limits  COMPREHENSIVE METABOLIC PANEL - Abnormal; Notable for the following:    Sodium 128 (*)    Chloride 95 (*)    Glucose, Bld 116 (*)    All other components within normal limits  INFLUENZA PANEL BY PCR (TYPE A & B) - Abnormal; Notable for the following:    Influenza B By PCR POSITIVE (*)    All other components within normal limits  URINALYSIS, COMPLETE (UACMP) WITH MICROSCOPIC - Abnormal; Notable for the following:    Color, Urine COLORLESS (*)    APPearance CLEAR (*)    Specific Gravity, Urine 1.002 (*)    Squamous Epithelial / LPF 0-5 (*)    All other components within normal limits  DIFFERENTIAL - Abnormal; Notable for the following:    Lymphs Abs 0.7 (*)    All other components within normal limits  RAPID HIV SCREEN (HIV 1/2 AB+AG)  TROPONIN I  CBC WITH DIFFERENTIAL/PLATELET    RADIOLOGY Images were viewed by me  Chest x-ray IMPRESSION: No evidence of pulmonary emboli.  No acute abnormality noted.  IMPRESSION: Normal chest. ____________________________________________  FINAL ASSESSMENT AND PLAN  Influenza B, persistent cough, headache  Plan: Patient with labs and imaging as dictated above. Patient presented to the ER with a month long history of illness treated with successive antibiotics without improvement. She is found to be influenza B-positive. It is likely that she's had one or more respiratory infections preceding this. She is currently taking Tamiflu, she'll be discharged with Tussionex and encouraged to have follow-up with her doctor.   Earleen Newport, MD   Note: This note was generated in part or whole with voice recognition software. Voice recognition is usually quite accurate but there are transcription errors that can and very often do occur. I apologize for any typographical errors that were not detected and  corrected.     Earleen Newport, MD 08/29/16 Drema Halon

## 2017-06-28 ENCOUNTER — Telehealth: Payer: Self-pay | Admitting: Gastroenterology

## 2017-06-28 NOTE — Telephone Encounter (Signed)
Returned patient's callback to schedule colonoscopy.  LVM for patient callback to office.

## 2017-06-28 NOTE — Telephone Encounter (Signed)
Patient called back to schedule a colonoscopy.

## 2017-06-28 NOTE — Telephone Encounter (Signed)
Patient left a voice message that she missed your call for a colonoscopy

## 2017-06-30 ENCOUNTER — Other Ambulatory Visit: Payer: Self-pay

## 2017-06-30 ENCOUNTER — Telehealth: Payer: Self-pay | Admitting: Gastroenterology

## 2017-06-30 DIAGNOSIS — Z1211 Encounter for screening for malignant neoplasm of colon: Secondary | ICD-10-CM

## 2017-06-30 NOTE — Telephone Encounter (Signed)
Patient LVM asking for Sharyn Lull to call her regarding the appt she made Home [phone] Or Work [phone]

## 2017-06-30 NOTE — Telephone Encounter (Signed)
Gastroenterology Pre-Procedure Review  Request Date: 07/14/17 Requesting Physician: Dr. Vicente Males  PATIENT REVIEW QUESTIONS: The patient responded to the following health history questions as indicated:    1. Are you having any GI issues? yes (hemorrhoids) 2. Do you have a personal history of Polyps? no 3. Do you have a family history of Colon Cancer or Polyps? no 4. Diabetes Mellitus? no 5. Joint replacements in the past 12 months?no 6. Major health problems in the past 3 months?no 7. Any artificial heart valves, MVP, or defibrillator?no    MEDICATIONS & ALLERGIES:    Patient reports the following regarding taking any anticoagulation/antiplatelet therapy:   Plavix, Coumadin, Eliquis, Xarelto, Lovenox, Pradaxa, Brilinta, or Effient? no Aspirin? no  Patient confirms/reports the following medications:  Current Outpatient Medications  Medication Sig Dispense Refill  . ALPRAZolam (XANAX) 0.25 MG tablet Take 0.25 mg by mouth at bedtime as needed for anxiety.    . chlorpheniramine-HYDROcodone (TUSSIONEX PENNKINETIC ER) 10-8 MG/5ML SUER Take 5 mLs by mouth 2 (two) times daily. 140 mL 0  . diazepam (VALIUM) 5 MG tablet Take 1 tablet (5 mg total) by mouth every 12 (twelve) hours as needed. 30 tablet 0  . DULoxetine (CYMBALTA) 30 MG capsule Take 30 mg by mouth daily.    Marland Kitchen ibuprofen (ADVIL,MOTRIN) 600 MG tablet Take by mouth.    . ondansetron (ZOFRAN) 4 MG tablet Take by mouth.    . oxyCODONE-acetaminophen (ROXICET) 5-325 MG per tablet Take 1 tablet by mouth every 6 (six) hours as needed for severe pain. 25 tablet 0  . pantoprazole (PROTONIX) 40 MG tablet Take 1 tablet (40 mg total) by mouth daily. 30 tablet 1  . sucralfate (CARAFATE) 1 G tablet Take 1 tablet (1 g total) by mouth 4 (four) times daily. (Patient not taking: Reported on 03/03/2015) 120 tablet 1   No current facility-administered medications for this visit.     Patient confirms/reports the following allergies:  Allergies  Allergen  Reactions  . Codeine Itching  . Morphine And Related Other (See Comments)    hallucinations    No orders of the defined types were placed in this encounter.   AUTHORIZATION INFORMATION Primary Insurance: 1D#: Group #:  Secondary Insurance: 1D#: Group #:  SCHEDULE INFORMATION: Date: 07/14/17 Time: Location:[facility]

## 2017-06-30 NOTE — Telephone Encounter (Signed)
Patient stated that she is ok with the date we have scheduled- wanted make sure she was aware she had to eat low fiber on Christmas.  She said she would not have a problem with it.  Thanks [employer]

## 2017-07-06 ENCOUNTER — Emergency Department
Admission: EM | Admit: 2017-07-06 | Discharge: 2017-07-06 | Disposition: A | Discharge status: Home / Self Care | Payer: BLUE CROSS/BLUE SHIELD | Attending: Emergency Medicine | Admitting: Emergency Medicine

## 2017-07-06 ENCOUNTER — Encounter: Payer: Self-pay | Admitting: Emergency Medicine

## 2017-07-06 ENCOUNTER — Emergency Department: Payer: BLUE CROSS/BLUE SHIELD

## 2017-07-06 DIAGNOSIS — Y9301 Activity, walking, marching and hiking: Secondary | ICD-10-CM | POA: Diagnosis not present

## 2017-07-06 DIAGNOSIS — W010XXA Fall on same level from slipping, tripping and stumbling without subsequent striking against object, initial encounter: Secondary | ICD-10-CM | POA: Insufficient documentation

## 2017-07-06 DIAGNOSIS — S022XXA Fracture of nasal bones, initial encounter for closed fracture: Secondary | ICD-10-CM | POA: Diagnosis not present

## 2017-07-06 DIAGNOSIS — S0083XA Contusion of other part of head, initial encounter: Secondary | ICD-10-CM

## 2017-07-06 DIAGNOSIS — Z79899 Other long term (current) drug therapy: Secondary | ICD-10-CM | POA: Insufficient documentation

## 2017-07-06 DIAGNOSIS — S0033XA Contusion of nose, initial encounter: Secondary | ICD-10-CM | POA: Insufficient documentation

## 2017-07-06 DIAGNOSIS — T148XXA Other injury of unspecified body region, initial encounter: Secondary | ICD-10-CM

## 2017-07-06 DIAGNOSIS — S0993XA Unspecified injury of face, initial encounter: Secondary | ICD-10-CM | POA: Diagnosis present

## 2017-07-06 DIAGNOSIS — Y999 Unspecified external cause status: Secondary | ICD-10-CM | POA: Diagnosis not present

## 2017-07-06 DIAGNOSIS — Y929 Unspecified place or not applicable: Secondary | ICD-10-CM | POA: Insufficient documentation

## 2017-07-06 DIAGNOSIS — Y92009 Unspecified place in unspecified non-institutional (private) residence as the place of occurrence of the external cause: Secondary | ICD-10-CM

## 2017-07-06 DIAGNOSIS — W19XXXA Unspecified fall, initial encounter: Secondary | ICD-10-CM

## 2017-07-06 MED ORDER — HYDROCODONE-ACETAMINOPHEN 5-325 MG PO TABS
1.0000 | ORAL_TABLET | Freq: Once | ORAL | Status: AC
  Administered 2017-07-06: 1 via ORAL
  Filled 2017-07-06: qty 1

## 2017-07-06 MED ORDER — CYCLOBENZAPRINE HCL 10 MG PO TABS
10.0000 mg | ORAL_TABLET | Freq: Once | ORAL | Status: AC
  Administered 2017-07-06: 10 mg via ORAL
  Filled 2017-07-06: qty 1

## 2017-07-06 MED ORDER — OXYCODONE-ACETAMINOPHEN 5-325 MG PO TABS: 1.0000 | ORAL_TABLET | Freq: Three times a day (TID) | ORAL | 0 refills | Status: DC | PRN

## 2017-07-06 MED ORDER — ONDANSETRON 4 MG PO TBDP
4.0000 mg | ORAL_TABLET | Freq: Once | ORAL | Status: AC
  Administered 2017-07-06: 4 mg via ORAL
  Filled 2017-07-06: qty 1

## 2017-07-06 MED ORDER — ONDANSETRON 4 MG PO TBDP
4.0000 mg | ORAL_TABLET | Freq: Three times a day (TID) | ORAL | 0 refills | Status: DC | PRN
Start: 2017-07-06 — End: 2018-04-04

## 2017-07-06 MED ORDER — CYCLOBENZAPRINE HCL 5 MG PO TABS: 5.0000 mg | ORAL_TABLET | Freq: Three times a day (TID) | ORAL | 0 refills | Status: DC | PRN

## 2017-07-06 MED ORDER — NABUMETONE 750 MG PO TABS: 750.0000 mg | ORAL_TABLET | Freq: Two times a day (BID) | ORAL | 0 refills | Status: DC

## 2017-07-06 NOTE — Discharge Instructions (Signed)
Your exam and CT scans are normal except for a small, non-displaced fracture to the nose. You should rest with your head elevated. Apply cool compresses to reduce swelling. Take the prescription meds as needed. Follow-up with your provider for continued symptoms.

## 2017-07-06 NOTE — ED Notes (Signed)
Patient ambulated across the hall to the bathroom with a steady gait.

## 2017-07-06 NOTE — ED Triage Notes (Addendum)
Pt in via POV; pt reports tripping over dog last night, landing face first on a space heater.  Pt with significant bruising and sweling to nose and bilateral eyes.  Pt denies LOC.  Pt ambulatory to triage, appears anxious and jittery, vitals WDL.

## 2017-07-07 NOTE — ED Provider Notes (Signed)
[facility] Emergency Department Provider Note ____________________________________________  Time seen: 1627  I have reviewed the triage vital signs and the nursing notes.  HISTORY  Chief Complaint  Fall  HPI Sarah Clarke is a 56 y.o. female present to the ED, accompanied by her boyfriend, for evaluation of injury sustained following a mechanical fall last night.  Patient describes he was distracted while talking on her cell phone, he apparently tripped over her small dog and landed, face first onto her face either.  She denies any loss of consciousness but admits to significant bleeding from the nose.  She presents today.  Due to increased pain, swelling, and bruising around the eyes and face.  Denies any nausea, vomiting, or dizziness.  She also denies any chest pain, shortness of breath.  She denies any visual disturbance but does note tightness and swelling to the orbital region.  No dental injury is reported at this time.  She also has some neck pain and gives a remote history of cervical fusion.  She denies any distal paresthesias.  Past Medical History:  Diagnosis Date  . Anxiety   . Arthritis     Patient Active Problem List   Diagnosis Date Noted  . Adjustment disorder with mixed anxiety and depressed mood 01/03/2015  . Cecal volvulus (Nathalie) 12/29/2014    Past Surgical History:  Procedure Laterality Date  . ABDOMINAL HYSTERECTOMY     Partial  . COLON SURGERY    . LAPAROTOMY N/A 12/29/2014   Procedure: EXPLORATORY LAPAROTOMY with right hemicolectomy ;  Surgeon: Marlyce Huge, MD;  Location: [facility] ORS;  Service: General;  Laterality: N/A;  . neck fusion      Prior to Admission medications   Medication Sig Start Date End Date Taking? Authorizing Provider  ALPRAZolam Duanne Moron) 0.25 MG tablet Take 0.25 mg by mouth at bedtime as needed for anxiety.    [provider]  chlorpheniramine-HYDROcodone (TUSSIONEX PENNKINETIC ER) 10-8 MG/5ML  SUER Take 5 mLs by mouth 2 (two) times daily. 08/29/16   Earleen Newport, MD  cyclobenzaprine (FLEXERIL) 5 MG tablet Take 1 tablet (5 mg total) by mouth 3 (three) times daily as needed for muscle spasms. 07/06/17   Oliva Montecalvo, Dannielle Karvonen, PA-C  diazepam (VALIUM) 5 MG tablet Take 1 tablet (5 mg total) by mouth every 12 (twelve) hours as needed. 01/29/15   Marlyce Huge, MD  DULoxetine (CYMBALTA) 30 MG capsule Take 30 mg by mouth daily.    [provider]  ibuprofen (ADVIL,MOTRIN) 600 MG tablet Take by mouth. 07/30/16   [provider]  nabumetone (RELAFEN) 750 MG tablet Take 1 tablet (750 mg total) by mouth 2 (two) times daily. 07/06/17   Karia Ehresman, Dannielle Karvonen, PA-C  ondansetron (ZOFRAN ODT) 4 MG disintegrating tablet Take 1 tablet (4 mg total) by mouth every 8 (eight) hours as needed. 07/06/17   Corbin Falck, Dannielle Karvonen, PA-C  ondansetron (ZOFRAN) 4 MG tablet Take by mouth. 07/28/16   [provider]  oxyCODONE-acetaminophen (ROXICET) 5-325 MG tablet Take 1 tablet by mouth every 8 (eight) hours as needed. 07/06/17   Avagrace Botelho, Dannielle Karvonen, PA-C  pantoprazole (PROTONIX) 40 MG tablet Take 1 tablet (40 mg total) by mouth daily. 02/19/15 02/19/16  Nance Pear, MD  sucralfate (CARAFATE) 1 G tablet Take 1 tablet (1 g total) by mouth 4 (four) times daily. Patient not taking: Reported on 03/03/2015 02/19/15 02/19/16  Nance Pear, MD    Allergies Codeine and Morphine and related  Family History  Problem Relation Age of Onset  . Diabetes Mother   . Hypertension Father     Social History Social History   Tobacco Use  . Smoking status: Never Smoker  . Smokeless tobacco: Never Used  Substance Use Topics  . Alcohol use: Yes    Alcohol/week: 14.4 oz    Types: 24 Cans of beer per week  . Drug use: No    Review of Systems  Constitutional: Negative for fever. Eyes: Negative for visual changes. Bilateral black eyes ENT: Negative for sore throat. Facial  swelling. No nose bleed Cardiovascular: Negative for chest pain. Respiratory: Negative for shortness of breath. Gastrointestinal: Negative for abdominal pain, vomiting and diarrhea. Genitourinary: Negative for dysuria. Musculoskeletal: Negative for back pain. Neck pain and stiffness as above Skin: Negative for rash. Neurological: Negative for headaches, focal weakness or numbness. ____________________________________________  PHYSICAL EXAM:  VITAL SIGNS: ED Triage Vitals  Enc Vitals Group     BP 07/06/17 1549 (!) 163/77     Pulse Rate 07/06/17 1549 92     Resp 07/06/17 1549 20     Temp 07/06/17 1549 97.8 F (36.6 C)     Temp Source 07/06/17 1549 Oral     SpO2 07/06/17 1549 98 %     Weight 07/06/17 1549 120 lb (54.4 kg)     Height 07/06/17 1549 5\' 7"  (1.702 m)     Head Circumference --      Peak Flow --      Pain Score 07/06/17 1548 8     Pain Loc --      Pain Edu? --      Excl. in [place]? --     Constitutional: Alert and oriented. Well appearing and in no distress. Head: Normocephalic and atraumatic. Eyes: Conjunctivae are normal. PERRL. Normal extraocular movements. Normal fundi bilaterally. Moderate infraorbital ecchymosis bilaterally. Ears: Canals clear. TMs intact bilaterally. Nose: No congestion/rhinorrhea/epistaxis. Moderate nasal bridge swelling. No obvious nasal deformity.  Mouth/Throat: Mucous membranes are moist.  Uvula is midline and tonsils are flat.  No dental injury is appreciated. Neck: Supple. No thyromegaly. Normal ROM Cardiovascular: Normal rate, regular rhythm. Normal distal pulses. Respiratory: Normal respiratory effort. No wheezes/rales/rhonchi. Gastrointestinal: Soft and nontender. No distention. Musculoskeletal: Normal spinal alignment without midline tenderness, spasm, deformity, or step-off.  Tenderness to the bilateral upper trapezius muscles. Nontender with normal range of motion in all extremities.  Neurologic: CN II-XII grossly intact. Normal gait  without ataxia. Normal speech and language. No gross focal neurologic deficits are appreciated. Skin:  Skin is warm, dry and intact. No rash noted. Psychiatric: Mood is anxious and affect are excited. Patient exhibits appropriate insight and judgment. ____________________________________________   RADIOLOGY  CT Maxillofacial & CT Cervical Spine  IMPRESSION: 1. Mild deformity of the nasal bones, consistent with mild fracture. No evidence for orbital fracture. 2. Straightening of the cervical spine without fracture seen 3. Abnormal enlarged prevertebral soft tissues C2 through C4 with evidence of retropharyngeal fluid collection. Appearance not typical for trauma, query any signs or symptoms for infection. MRI is suggested for further evaluation. ____________________________________________  PROCEDURES  Procedures  Flexeril 10 mg PO Norco 5-325 mg PO Zofran 4 mg ODT ____________________________________________  INITIAL IMPRESSION / ASSESSMENT AND PLAN / ED COURSE  Patient ED evaluation of injury sustained following mechanical fall at home.  Patient sustained significant facial contusions after falling into her space heater.  Her CT scan is significant for nondisplaced fractures of the nasal bones.  CT scan of the  neck shows no acute fracture dislocation.  She is discharged with prescriptions for cyclobenzaprine, Relafen, Zofran, and oxycodone for pain relief.  She is referred to [facility] for further evaluation as needed.  She is advised to rest with the head elevated and apply ice to reduce swelling and bruising.  Return precautions have been reviewed. ____________________________________________  FINAL CLINICAL IMPRESSION(S) / ED DIAGNOSES  Final diagnoses:  Fall in home, initial encounter  Contusion of face, initial encounter  Closed fracture of nasal bone, initial encounter  Hematoma      Carmie End, Dannielle Karvonen, PA-C 07/07/17 1937    Carrie Mew, MD 07/19/17  2012

## 2017-07-08 ENCOUNTER — Encounter: Payer: Self-pay | Admitting: *Deleted

## 2017-07-11 ENCOUNTER — Other Ambulatory Visit: Payer: Self-pay

## 2017-07-11 NOTE — Progress Notes (Signed)
LVM to nottify pt rx has been changed to gavelyte drink 8 oz every 20 minutes until bowels run clear.  Rx called to [facility] [address]  Monica Becton

## 2017-07-14 ENCOUNTER — Ambulatory Visit: Payer: BLUE CROSS/BLUE SHIELD | Admitting: Anesthesiology

## 2017-07-14 ENCOUNTER — Ambulatory Visit
Admission: RE | Admit: 2017-07-14 | Discharge: 2017-07-14 | Disposition: A | Discharge status: Home / Self Care | Payer: BLUE CROSS/BLUE SHIELD | Source: Ambulatory Visit | Attending: Gastroenterology | Admitting: Gastroenterology

## 2017-07-14 ENCOUNTER — Encounter: Payer: Self-pay | Admitting: *Deleted

## 2017-07-14 ENCOUNTER — Encounter
Admission: RE | Disposition: A | Discharge status: Home / Self Care | Payer: Self-pay | Source: Ambulatory Visit | Attending: Gastroenterology

## 2017-07-14 DIAGNOSIS — K5730 Diverticulosis of large intestine without perforation or abscess without bleeding: Secondary | ICD-10-CM | POA: Diagnosis not present

## 2017-07-14 DIAGNOSIS — Z79899 Other long term (current) drug therapy: Secondary | ICD-10-CM | POA: Diagnosis not present

## 2017-07-14 DIAGNOSIS — M1990 Unspecified osteoarthritis, unspecified site: Secondary | ICD-10-CM | POA: Diagnosis not present

## 2017-07-14 DIAGNOSIS — Z885 Allergy status to narcotic agent status: Secondary | ICD-10-CM | POA: Diagnosis not present

## 2017-07-14 DIAGNOSIS — D122 Benign neoplasm of ascending colon: Secondary | ICD-10-CM | POA: Insufficient documentation

## 2017-07-14 DIAGNOSIS — F419 Anxiety disorder, unspecified: Secondary | ICD-10-CM | POA: Diagnosis not present

## 2017-07-14 DIAGNOSIS — D124 Benign neoplasm of descending colon: Secondary | ICD-10-CM | POA: Diagnosis not present

## 2017-07-14 DIAGNOSIS — Z1211 Encounter for screening for malignant neoplasm of colon: Secondary | ICD-10-CM | POA: Insufficient documentation

## 2017-07-14 DIAGNOSIS — Z8371 Family history of colonic polyps: Secondary | ICD-10-CM | POA: Insufficient documentation

## 2017-07-14 DIAGNOSIS — K640 First degree hemorrhoids: Secondary | ICD-10-CM | POA: Diagnosis not present

## 2017-07-14 HISTORY — PX: COLONOSCOPY WITH PROPOFOL: SHX5780

## 2017-07-14 SURGERY — COLONOSCOPY WITH PROPOFOL
Anesthesia: General

## 2017-07-14 MED ORDER — FENTANYL CITRATE (PF) 100 MCG/2ML IJ SOLN
INTRAMUSCULAR | Status: DC | PRN
  Administered 2017-07-14: 50 ug via INTRAVENOUS

## 2017-07-14 MED ORDER — MIDAZOLAM HCL 2 MG/2ML IJ SOLN
INTRAMUSCULAR | Status: DC | PRN
  Administered 2017-07-14: 2 mg via INTRAVENOUS

## 2017-07-14 MED ORDER — FENTANYL CITRATE (PF) 100 MCG/2ML IJ SOLN
INTRAMUSCULAR | Status: AC
  Filled 2017-07-14: qty 2

## 2017-07-14 MED ORDER — PROPOFOL 500 MG/50ML IV EMUL
INTRAVENOUS | Status: DC | PRN
  Administered 2017-07-14: 120 ug/kg/min via INTRAVENOUS

## 2017-07-14 MED ORDER — PROPOFOL 500 MG/50ML IV EMUL
INTRAVENOUS | Status: AC
  Filled 2017-07-14: qty 50

## 2017-07-14 MED ORDER — MIDAZOLAM HCL 2 MG/2ML IJ SOLN
INTRAMUSCULAR | Status: AC
  Filled 2017-07-14: qty 2

## 2017-07-14 MED ORDER — SODIUM CHLORIDE 0.9 % IV SOLN
INTRAVENOUS | Status: DC
  Administered 2017-07-14: 1000 mL via INTRAVENOUS

## 2017-07-14 NOTE — Transfer of Care (Signed)
Immediate Anesthesia Transfer of Care Note  Patient: Sarah Clarke  Procedure(s) Performed: COLONOSCOPY WITH PROPOFOL (N/A )  Patient Location: PACU  Anesthesia Type:General  Level of Consciousness: awake and sedated  Airway & Oxygen Therapy: Patient Spontanous Breathing and Patient connected to nasal cannula oxygen  Post-op Assessment: Report given to RN and Post -op Vital signs reviewed and stable  Post vital signs: Reviewed and stable  Last Vitals:  Vitals:   07/14/17 0823  BP: 129/77  Pulse: 88  Resp: 18  Temp: (!) 36.3 C  SpO2: 100%    Last Pain:  Vitals:   07/14/17 0823  TempSrc: Tympanic         Complications: No apparent anesthesia complications

## 2017-07-14 NOTE — H&P (Addendum)
Jonathon Bellows, MD [address] [place], [place], [place], [address] [address], [place], [place], [place], [address] Phone: [phone]  Fax: [phone]  Primary Care Physician:  Lavera Guise, MD   Pre-Procedure History & Physical: HPI:  Sarah Clarke is a 56 y.o. female is here for an colonoscopy.   Past Medical History:  Diagnosis Date  . Anxiety   . Arthritis     Past Surgical History:  Procedure Laterality Date  . ABDOMINAL HYSTERECTOMY     Partial  . COLON SURGERY    . LAPAROTOMY N/A 12/29/2014   Procedure: EXPLORATORY LAPAROTOMY with right hemicolectomy ;  Surgeon: Marlyce Huge, MD;  Location: [facility] ORS;  Service: General;  Laterality: N/A;  . neck fusion      Prior to Admission medications   Medication Sig Start Date End Date Taking? Authorizing Provider  ALPRAZolam Duanne Moron) 0.25 MG tablet Take 0.25 mg by mouth at bedtime as needed for anxiety.    [provider]  chlorpheniramine-HYDROcodone (TUSSIONEX PENNKINETIC ER) 10-8 MG/5ML SUER Take 5 mLs by mouth 2 (two) times daily. 08/29/16   Earleen Newport, MD  cyclobenzaprine (FLEXERIL) 5 MG tablet Take 1 tablet (5 mg total) by mouth 3 (three) times daily as needed for muscle spasms. 07/06/17   Menshew, Dannielle Karvonen, PA-C  diazepam (VALIUM) 5 MG tablet Take 1 tablet (5 mg total) by mouth every 12 (twelve) hours as needed. 01/29/15   Marlyce Huge, MD  DULoxetine (CYMBALTA) 30 MG capsule Take 30 mg by mouth daily.    [provider]  ibuprofen (ADVIL,MOTRIN) 600 MG tablet Take by mouth. 07/30/16   [provider]  nabumetone (RELAFEN) 750 MG tablet Take 1 tablet (750 mg total) by mouth 2 (two) times daily. 07/06/17   Menshew, Dannielle Karvonen, PA-C  ondansetron (ZOFRAN ODT) 4 MG disintegrating tablet Take 1 tablet (4 mg total) by mouth every 8 (eight) hours as needed. 07/06/17   Menshew, Dannielle Karvonen, PA-C  ondansetron (ZOFRAN) 4 MG tablet Take by mouth. 07/28/16    [provider]  oxyCODONE-acetaminophen (ROXICET) 5-325 MG tablet Take 1 tablet by mouth every 8 (eight) hours as needed. 07/06/17   Menshew, Dannielle Karvonen, PA-C  pantoprazole (PROTONIX) 40 MG tablet Take 1 tablet (40 mg total) by mouth daily. 02/19/15 02/19/16  Nance Pear, MD  sucralfate (CARAFATE) 1 G tablet Take 1 tablet (1 g total) by mouth 4 (four) times daily. Patient not taking: Reported on 03/03/2015 02/19/15 02/19/16  Nance Pear, MD    Allergies as of 06/30/2017 - Review Complete 08/29/2016  Allergen Reaction Noted  . Codeine Itching 12/29/2014  . Morphine and related Other (See Comments) 03/19/2014    Family History  Problem Relation Age of Onset  . Diabetes Mother   . Hypertension Father     Social History   Socioeconomic History  . Marital status: Divorced    Spouse name: Not on file  . Number of children: Not on file  . Years of education: Not on file  . Highest education level: Not on file  Social Needs  . Financial resource strain: Not on file  . Food insecurity - worry: Not on file  . Food insecurity - inability: Not on file  . Transportation needs - medical: Not on file  . Transportation needs - non-medical: Not on file  Occupational History  . Not on file  Tobacco Use  . Smoking status: Never Smoker  . Smokeless  tobacco: Never Used  Substance and Sexual Activity  . Alcohol use: Yes    Alcohol/week: 14.4 oz    Types: 24 Cans of beer per week  . Drug use: No  . Sexual activity: Not Currently  Other Topics Concern  . Not on file  Social History Narrative  . Not on file    Review of Systems: See HPI, otherwise negative ROS  Physical Exam: There were no vitals taken for this visit. General:   Alert,  pleasant and cooperative in NAD Head:  Normocephalic and atraumatic. Neck:  Supple; no masses or thyromegaly. Lungs:  Clear throughout to auscultation, normal respiratory effort.    Heart:  +S1, +S2, Regular rate and rhythm, No  edema. Abdomen:  Soft, nontender and nondistended. Normal bowel sounds, without guarding, and without rebound.   Neurologic:  Alert and  oriented x4;  grossly normal neurologically.  Impression/Plan: Sarah Clarke is here for an colonoscopy to be performed for Screening colonoscopy , family history of colon polyps  Risks, benefits, limitations, and alternatives regarding  colonoscopy have been reviewed with the patient.  Questions have been answered.  All parties agreeable.   Jonathon Bellows, MD  07/14/2017, 8:16 AM

## 2017-07-14 NOTE — Anesthesia Post-op Follow-up Note (Signed)
Anesthesia QCDR form completed.        

## 2017-07-14 NOTE — Op Note (Signed)
[facility] Gastroenterology Patient Name: Sarah Clarke Procedure Date: 07/14/2017 9:06 AM MRN: [id number] Account #: [id number] Date of Birth: 1960-11-28 Admit Type: Outpatient Age: 56 Room: [facility] ENDO ROOM 3 Gender: Female Note Status: Finalized Procedure:            Colonoscopy Indications:          Colon cancer screening in patient at increased risk:                        Family history of 1st-degree relative with colon polyps Providers:            Jonathon Bellows MD, MD Referring MD:         Lavera Guise, MD (Referring MD) Medicines:            Monitored Anesthesia Care Complications:        No immediate complications. Procedure:            Pre-Anesthesia Assessment:                       - Prior to the procedure, a History and Physical was                        performed, and patient medications, allergies and                        sensitivities were reviewed. The patient's tolerance of                        previous anesthesia was reviewed.                       - The risks and benefits of the procedure and the                        sedation options and risks were discussed with the                        patient. All questions were answered and informed                        consent was obtained.                       - ASA Grade Assessment: III - A patient with severe                        systemic disease.                       After obtaining informed consent, the colonoscope was                        passed under direct vision. Throughout the procedure,                        the patient's blood pressure, pulse, and oxygen                        saturations were monitored continuously. The  Colonoscope was introduced through the anus and                        advanced to the the ileocolonic anastomosis. The                        colonoscopy was performed with ease. The patient                        tolerated the procedure  well. The quality of the bowel                        preparation was good. Findings:      The perianal and digital rectal examinations were normal.      Non-bleeding internal hemorrhoids were found during retroflexion. The       hemorrhoids were medium-sized and Grade I (internal hemorrhoids that do       not prolapse).      Multiple small-mouthed diverticula were found in the entire colon.      A 3 mm polyp was found in the ascending colon. The polyp was sessile.       The polyp was removed with a cold biopsy forceps. Resection and       retrieval were complete.      A 8 mm polyp was found in the descending colon. The polyp was sessile.       The polyp was removed with a cold snare. Resection and retrieval were       complete.      The exam was otherwise without abnormality on direct and retroflexion       views. Impression:           - Non-bleeding internal hemorrhoids.                       - Diverticulosis in the entire examined colon.                       - One 3 mm polyp in the ascending colon, removed with a                        cold biopsy forceps. Resected and retrieved.                       - One 8 mm polyp in the descending colon, removed with                        a cold snare. Resected and retrieved.                       - The examination was otherwise normal on direct and                        retroflexion views. Recommendation:       - Discharge patient to home (with escort).                       - Resume previous diet.                       - Continue present medications.                       -  Await pathology results.                       - Repeat colonoscopy in 5 years for surveillance. Procedure Code(s):    --- Professional ---                       [phone], Colonoscopy, flexible; with removal of tumor(s),                        polyp(s), or other lesion(s) by snare technique                       [id number], 80, Colonoscopy, flexible; with biopsy, single                         or multiple Diagnosis Code(s):    --- Professional ---                       Z83.71, Family history of colonic polyps                       K64.0, First degree hemorrhoids                       D12.2, Benign neoplasm of ascending colon                       D12.4, Benign neoplasm of descending colon                       K57.30, Diverticulosis of large intestine without                        perforation or abscess without bleeding CPT copyright 2016 American Medical Association. All rights reserved. The codes documented in this report are preliminary and upon coder review may  be revised to meet current compliance requirements. Jonathon Bellows, MD Jonathon Bellows MD, MD 07/14/2017 9:36:27 AM This report has been signed electronically. Number of Addenda: 0 Note Initiated On: 07/14/2017 9:06 AM Scope Withdrawal Time: 0 hours 14 minutes 21 seconds  Total Procedure Duration: 0 hours 19 minutes 36 seconds       [facility]

## 2017-07-14 NOTE — Anesthesia Procedure Notes (Signed)
Performed by: Cook-Martin, Panayiotis [place] Pre-anesthesia Checklist: Patient identified, Emergency Drugs available, Suction available, Patient being monitored and Timeout performed Patient Re-evaluated:Patient Re-evaluated prior to induction Oxygen Delivery Method: Nasal cannula Preoxygenation: Pre-oxygenation with 100% oxygen Induction Type: IV induction Placement Confirmation: positive ETCO2 and CO2 detector       

## 2017-07-14 NOTE — Anesthesia Preprocedure Evaluation (Signed)
Anesthesia Evaluation  Patient identified by MRN, date of birth, ID band Patient awake    Reviewed: Allergy & Precautions, H&P , NPO status , Patient's Chart, lab work & pertinent test results  History of Anesthesia Complications Negative for: history of anesthetic complications  Airway Mallampati: III  TM Distance: <3 FB Neck ROM: limited    Dental  (+) Chipped   Pulmonary neg pulmonary ROS, neg shortness of breath,           Cardiovascular Exercise Tolerance: Good (-) angina(-) Past MI and (-) DOE negative cardio ROS       Neuro/Psych PSYCHIATRIC DISORDERS Anxiety negative neurological ROS     GI/Hepatic negative GI ROS, Neg liver ROS, neg GERD  ,  Endo/Other  negative endocrine ROS  Renal/GU negative Renal ROS  negative genitourinary   Musculoskeletal  (+) Arthritis ,   Abdominal   Peds  Hematology negative hematology ROS (+)   Anesthesia Other Findings Bruising around eyes   Past Medical History: No date: Anxiety No date: Arthritis  Past Surgical History: No date: ABDOMINAL HYSTERECTOMY     Comment:  Partial No date: BACK SURGERY No date: COLON SURGERY 12/29/2014: LAPAROTOMY; N/A     Comment:  Procedure: EXPLORATORY LAPAROTOMY with right               hemicolectomy ;  Surgeon: Marlyce Huge, MD;                Location: [facility] ORS;  Service: General;  Laterality: N/A; No date: neck fusion  BMI    Body Mass Index:  18.79 kg/m      Reproductive/Obstetrics negative OB ROS                             Anesthesia Physical Anesthesia Plan  ASA: III  Anesthesia Plan: General   Post-op Pain Management:    Induction: Intravenous  PONV Risk Score and Plan: Propofol infusion  Airway Management Planned: Natural Airway and Nasal Cannula  Additional Equipment:   Intra-op Plan:   Post-operative Plan:   Informed Consent: I have reviewed the patients History and  Physical, chart, labs and discussed the procedure including the risks, benefits and alternatives for the proposed anesthesia with the patient or authorized representative who has indicated his/her understanding and acceptance.   Dental Advisory Given  Plan Discussed with: Anesthesiologist, CRNA and Surgeon  Anesthesia Plan Comments: (Patient consented for risks of anesthesia including but not limited to:  - adverse reactions to medications - risk of intubation if required - damage to teeth, lips or other oral mucosa - sore throat or hoarseness - Damage to heart, brain, lungs or loss of life  Patient voiced understanding.)        Anesthesia Quick Evaluation

## 2017-07-14 NOTE — Anesthesia Postprocedure Evaluation (Signed)
Anesthesia Post Note  Patient: Sarah Clarke  Procedure(s) Performed: COLONOSCOPY WITH PROPOFOL (N/A )  Patient location during evaluation: Endoscopy Anesthesia Type: General Level of consciousness: awake and alert Pain management: pain level controlled Vital Signs Assessment: post-procedure vital signs reviewed and stable Respiratory status: spontaneous breathing, nonlabored ventilation, respiratory function stable and patient connected to nasal cannula oxygen Cardiovascular status: blood pressure returned to baseline and stable Postop Assessment: no apparent nausea or vomiting Anesthetic complications: no     Last Vitals:  Vitals:   07/14/17 0957 07/14/17 1007  BP: (!) 168/90 138/67  Pulse: 74 74  Resp: 13 14  Temp:    SpO2:  98%    Last Pain:  Vitals:   07/14/17 0937  TempSrc: Tympanic                 Precious Haws P[place]it[place]

## 2017-07-15 ENCOUNTER — Encounter: Payer: Self-pay | Admitting: Gastroenterology

## 2017-07-15 LAB — SURGICAL PATHOLOGY

## 2017-07-21 ENCOUNTER — Encounter: Payer: Self-pay | Admitting: Gastroenterology

## 2017-08-02 ENCOUNTER — Ambulatory Visit: Payer: BLUE CROSS/BLUE SHIELD | Admitting: Nurse Practitioner

## 2017-08-02 ENCOUNTER — Encounter: Payer: Self-pay | Admitting: Nurse Practitioner

## 2017-08-02 VITALS — BP 130/90 | HR 81 | Resp 16 | Ht 67.0 in | Wt 123.0 lb

## 2017-08-02 DIAGNOSIS — K219 Gastro-esophageal reflux disease without esophagitis: Secondary | ICD-10-CM | POA: Diagnosis not present

## 2017-08-02 DIAGNOSIS — G4700 Insomnia, unspecified: Secondary | ICD-10-CM

## 2017-08-02 MED ORDER — ALPRAZOLAM 0.25 MG PO TABS
0.2500 mg | ORAL_TABLET | Freq: Every evening | ORAL | 3 refills | Status: DC | PRN
Start: 2017-08-02 — End: 2017-12-01

## 2017-08-02 MED ORDER — PANTOPRAZOLE SODIUM 40 MG PO TBEC: 40.0000 mg | DELAYED_RELEASE_TABLET | Freq: Every day | ORAL | 5 refills | Status: DC

## 2017-08-02 NOTE — Progress Notes (Signed)
[facility] [place], [place] [address]  Internal MEDICINE  Office Visit Note  Patient Name: Sarah Clarke  [id number]  [id number]  Date of Service: 08/02/2017  No chief complaint on file.   The patient is here for routine follow up exam .she has no concerns or complaints at this time. She does take alprazolam 0.25mg  at bedtime when needed when she cannot fall asleep. She needs to have a refill for this today. Of note, she fell the week before christmas. Glasses were still on. She was seen in the ER and diagnosed with facial and nasal fractures. No displaced fracture noted. She has seen her eye doctor without any issues. Bruising and swelling have healed and she is doing well.     Pt is here for routine follow up.    Current Medication: Outpatient Encounter Medications as of 08/02/2017  Medication Sig  . ALPRAZolam (XANAX) 0.25 MG tablet Take 0.25 mg by mouth at bedtime as needed for anxiety.  Marland Kitchen ibuprofen (ADVIL,MOTRIN) 600 MG tablet Take by mouth.  . ondansetron (ZOFRAN ODT) 4 MG disintegrating tablet Take 1 tablet (4 mg total) by mouth every 8 (eight) hours as needed.  . chlorpheniramine-HYDROcodone (TUSSIONEX PENNKINETIC ER) 10-8 MG/5ML SUER Take 5 mLs by mouth 2 (two) times daily. (Patient not taking: Reported on 07/14/2017)  . cyclobenzaprine (FLEXERIL) 5 MG tablet Take 1 tablet (5 mg total) by mouth 3 (three) times daily as needed for muscle spasms. (Patient not taking: Reported on 08/02/2017)  . diazepam (VALIUM) 5 MG tablet Take 1 tablet (5 mg total) by mouth every 12 (twelve) hours as needed. (Patient not taking: Reported on 07/14/2017)  . DULoxetine (CYMBALTA) 30 MG capsule Take 30 mg by mouth daily.  . nabumetone (RELAFEN) 750 MG tablet Take 1 tablet (750 mg total) by mouth 2 (two) times daily. (Patient not taking: Reported on 07/14/2017)  . ondansetron (ZOFRAN) 4 MG tablet Take by mouth.  . oxyCODONE-acetaminophen (ROXICET) 5-325 MG tablet Take 1  tablet by mouth every 8 (eight) hours as needed. (Patient not taking: Reported on 07/14/2017)  . pantoprazole (PROTONIX) 40 MG tablet Take 1 tablet (40 mg total) by mouth daily.  . [DISCONTINUED] sucralfate (CARAFATE) 1 G tablet Take 1 tablet (1 g total) by mouth 4 (four) times daily. (Patient not taking: Reported on 03/03/2015)   No facility-administered encounter medications on file as of 08/02/2017.     Surgical History: Past Surgical History:  Procedure Laterality Date  . ABDOMINAL HYSTERECTOMY     Partial  . BACK SURGERY    . COLON SURGERY    . COLONOSCOPY WITH PROPOFOL N/A 07/14/2017   Procedure: COLONOSCOPY WITH PROPOFOL;  Surgeon: Jonathon Bellows, MD;  Location: [facility] ENDOSCOPY;  Service: Gastroenterology;  Laterality: N/A;  . LAPAROTOMY N/A 12/29/2014   Procedure: EXPLORATORY LAPAROTOMY with right hemicolectomy ;  Surgeon: Marlyce Huge, MD;  Location: [facility] ORS;  Service: General;  Laterality: N/A;  . neck fusion      Medical History: Past Medical History:  Diagnosis Date  . Anxiety   . Arthritis     Family History: Family History  Problem Relation Age of Onset  . Diabetes Mother   . Hypertension Father     Social History   Socioeconomic History  . Marital status: Divorced    Spouse name: Not on file  . Number of children: Not on file  . Years of education: Not on file  . Highest education level: Not on file  Social Needs  .  Financial resource strain: Not on file  . Food insecurity - worry: Not on file  . Food insecurity - inability: Not on file  . Transportation needs - medical: Not on file  . Transportation needs - non-medical: Not on file  Occupational History  . Not on file  Tobacco Use  . Smoking status: Never Smoker  . Smokeless tobacco: Never Used  Substance and Sexual Activity  . Alcohol use: Yes    Comment: social  . Drug use: No  . Sexual activity: Not Currently  Other Topics Concern  . Not on file  Social History Narrative  . Not on  file      Review of Systems  Constitutional: Negative for chills, fatigue and unexpected weight change.  HENT: Negative for congestion, postnasal drip, rhinorrhea, sneezing and sore throat.   Eyes: Negative for redness.  Respiratory: Negative for cough, chest tightness and shortness of breath.   Cardiovascular: Negative for chest pain and palpitations.  Gastrointestinal: Negative for abdominal pain, constipation, diarrhea, nausea and vomiting.  Genitourinary: Negative for dysuria and frequency.  Musculoskeletal: Negative for arthralgias, back pain, joint swelling and neck pain.  Skin: Negative for rash.  Neurological: Negative.  Negative for tremors and numbness.  Hematological: Negative for adenopathy. Does not bruise/bleed easily.  Psychiatric/Behavioral: Negative for behavioral problems (Depression), sleep disturbance and suicidal ideas. The patient is not nervous/anxious.     Today's Vitals   08/02/17 1100  BP: 130/90  Pulse: 81  Resp: 16  SpO2: 99%  Weight: 123 lb (55.8 kg)  Height: 5\' 7"  (1.702 m)    Physical Exam  Constitutional: She is oriented to person, place, and time. She appears well-developed and well-nourished.  HENT:  Head: Normocephalic and atraumatic.  Eyes: Conjunctivae and EOM are normal. Pupils are equal, round, and reactive to light.  Neck: Normal range of motion. Neck supple. No JVD present. No thyromegaly present.  No carotid bruit, bilaterally.   Cardiovascular: Normal rate, regular rhythm and normal heart sounds.  Pulmonary/Chest: Effort normal and breath sounds normal. No respiratory distress. She has no wheezes.  Abdominal: Soft. Bowel sounds are normal. There is no tenderness.  Musculoskeletal: Normal range of motion.  Neurological: She is alert and oriented to person, place, and time.  Skin: Skin is warm and dry.  Psychiatric: She has a normal mood and affect.  Nursing note and vitals reviewed.   Assessment/Plan:  1. Gastroesophageal  reflux disease without esophagitis - pantoprazole (PROTONIX) 40 MG tablet; Take 1 tablet (40 mg total) by mouth daily.  Dispense: 30 tablet; Refill: 5  2. Insomnia, unspecified type - ALPRAZolam (XANAX) 0.25 MG tablet; Take 1 tablet (0.25 mg total) by mouth at bedtime as needed for anxiety.  Dispense: 30 tablet; Refill: 3   General Counseling: Ernesta verbalizes understanding of the findings of todays visit and agrees with plan of treatment. I have discussed any further diagnostic evaluation that may be needed or ordered today. We also reviewed her medications today. she has been encouraged to call the office with any questions or concerns that should arise related to todays visit.  This patient was seen by Leretha Pol, FNP- C in Collaboration with Dr Lavera Guise as a part of collaborative care agreement    Time spent: 62 Minutes     Dr Lavera Guise Internal medicine

## 2017-12-01 ENCOUNTER — Encounter: Payer: Self-pay | Admitting: Nurse Practitioner

## 2017-12-01 ENCOUNTER — Ambulatory Visit: Payer: BLUE CROSS/BLUE SHIELD | Admitting: Nurse Practitioner

## 2017-12-01 VITALS — BP 139/75 | HR 76 | Resp 16 | Ht 67.0 in | Wt 124.8 lb

## 2017-12-01 DIAGNOSIS — G4700 Insomnia, unspecified: Secondary | ICD-10-CM | POA: Insufficient documentation

## 2017-12-01 DIAGNOSIS — K219 Gastro-esophageal reflux disease without esophagitis: Secondary | ICD-10-CM | POA: Diagnosis not present

## 2017-12-01 DIAGNOSIS — M150 Primary generalized (osteo)arthritis: Secondary | ICD-10-CM | POA: Insufficient documentation

## 2017-12-01 MED ORDER — ALPRAZOLAM 0.25 MG PO TABS: 0.2500 mg | ORAL_TABLET | Freq: Every evening | ORAL | 3 refills | Status: DC | PRN

## 2017-12-01 MED ORDER — PANTOPRAZOLE SODIUM 40 MG PO TBEC: 40.0000 mg | DELAYED_RELEASE_TABLET | Freq: Every day | ORAL | 5 refills | Status: DC

## 2017-12-01 NOTE — Progress Notes (Signed)
[facility] [place], [place] [address]  Internal MEDICINE  Office Visit Note  Patient Name: Sarah Clarke  [id number]  [id number]  Date of Service: 12/01/2017   Pt is here for routine follow up.   Chief Complaint  Patient presents with  . Gastroesophageal Reflux    follow up  . Osteoarthritis    multiple joints hurting, mostly in her elbows.     The patient states that she is having joint pain in multi;ple joints, especially in the elbows. Hurting more first thing in the morning. Has developed some arthritic nodules on hands and feet. ROM and strength is intact in all joints. She states that she started taking Tumeric daily for past 30 days. Has not noted a big improvement yet. She is unable to take NSAIDs due to significant GERD.  Improved acid reflux. Trying to avoid foods which trigger reflux. Has only had to take Protonix a few times over the past few months.  Does take alprazolam at night if needed. Has taken this less and less often. Does need to have refills for this today.        Current Medication: Outpatient Encounter Medications as of 12/01/2017  Medication Sig Note  . ALPRAZolam (XANAX) 0.25 MG tablet Take 1 tablet (0.25 mg total) by mouth at bedtime as needed for anxiety.   . [DISCONTINUED] ALPRAZolam (XANAX) 0.25 MG tablet Take 1 tablet (0.25 mg total) by mouth at bedtime as needed for anxiety.   . chlorpheniramine-HYDROcodone (TUSSIONEX PENNKINETIC ER) 10-8 MG/5ML SUER Take 5 mLs by mouth 2 (two) times daily. (Patient not taking: Reported on 07/14/2017)   . cyclobenzaprine (FLEXERIL) 5 MG tablet Take 1 tablet (5 mg total) by mouth 3 (three) times daily as needed for muscle spasms. (Patient not taking: Reported on 08/02/2017)   . diazepam (VALIUM) 5 MG tablet Take 1 tablet (5 mg total) by mouth every 12 (twelve) hours as needed. (Patient not taking: Reported on 07/14/2017)   . ibuprofen (ADVIL,MOTRIN) 600 MG tablet Take by mouth.   .  nabumetone (RELAFEN) 750 MG tablet Take 1 tablet (750 mg total) by mouth 2 (two) times daily. (Patient not taking: Reported on 07/14/2017)   . ondansetron (ZOFRAN ODT) 4 MG disintegrating tablet Take 1 tablet (4 mg total) by mouth every 8 (eight) hours as needed. (Patient not taking: Reported on 12/01/2017)   . ondansetron (ZOFRAN) 4 MG tablet Take by mouth.   . oxyCODONE-acetaminophen (ROXICET) 5-325 MG tablet Take 1 tablet by mouth every 8 (eight) hours as needed. (Patient not taking: Reported on 07/14/2017)   . pantoprazole (PROTONIX) 40 MG tablet Take 1 tablet (40 mg total) by mouth daily.   . [DISCONTINUED] DULoxetine (CYMBALTA) 30 MG capsule Take 30 mg by mouth daily. 12/01/2017: patient no longer taking.   . [DISCONTINUED] pantoprazole (PROTONIX) 40 MG tablet Take 1 tablet (40 mg total) by mouth daily. (Patient not taking: Reported on 12/01/2017)    No facility-administered encounter medications on file as of 12/01/2017.     Surgical History: Past Surgical History:  Procedure Laterality Date  . ABDOMINAL HYSTERECTOMY     Partial  . BACK SURGERY    . COLON SURGERY    . COLONOSCOPY WITH PROPOFOL N/A 07/14/2017   Procedure: COLONOSCOPY WITH PROPOFOL;  Surgeon: Jonathon Bellows, MD;  Location: [facility] ENDOSCOPY;  Service: Gastroenterology;  Laterality: N/A;  . LAPAROTOMY N/A 12/29/2014   Procedure: EXPLORATORY LAPAROTOMY with right hemicolectomy ;  Surgeon: Marlyce Huge, MD;  Location: [facility]  ORS;  Service: General;  Laterality: N/A;  . neck fusion      Medical History: Past Medical History:  Diagnosis Date  . Anxiety   . Arthritis     Family History: Family History  Problem Relation Age of Onset  . Diabetes Mother   . Hypertension Father     Social History   Socioeconomic History  . Marital status: Divorced    Spouse name: Not on file  . Number of children: Not on file  . Years of education: Not on file  . Highest education level: Not on file  Occupational History  . Not  on file  Social Needs  . Financial resource strain: Not on file  . Food insecurity:    Worry: Not on file    Inability: Not on file  . Transportation needs:    Medical: Not on file    Non-medical: Not on file  Tobacco Use  . Smoking status: Never Smoker  . Smokeless tobacco: Never Used  Substance and Sexual Activity  . Alcohol use: Yes    Comment: social  . Drug use: No  . Sexual activity: Not Currently  Lifestyle  . Physical activity:    Days per week: Not on file    Minutes per session: Not on file  . Stress: Not on file  Relationships  . Social connections:    Talks on phone: Not on file    Gets together: Not on file    Attends religious service: Not on file    Active member of club or organization: Not on file    Attends meetings of clubs or organizations: Not on file    Relationship status: Not on file  . Intimate partner violence:    Fear of current or ex partner: Not on file    Emotionally abused: Not on file    Physically abused: Not on file    Forced sexual activity: Not on file  Other Topics Concern  . Not on file  Social History Narrative  . Not on file      Review of Systems  Constitutional: Negative for chills, fatigue and unexpected weight change.  HENT: Negative for congestion, postnasal drip, rhinorrhea, sneezing and sore throat.   Eyes: Negative for redness.  Respiratory: Negative for cough, chest tightness and shortness of breath.   Cardiovascular: Negative for chest pain and palpitations.  Gastrointestinal: Negative for abdominal pain, constipation, diarrhea, nausea and vomiting.  Endocrine: Negative for cold intolerance, heat intolerance, polydipsia, polyphagia and polyuria.  Genitourinary: Negative for dysuria and frequency.  Musculoskeletal: Positive for arthralgias. Negative for back pain, joint swelling and neck pain.       Multiple joints, especially elbows.   Skin: Negative for rash.  Allergic/Immunologic: Negative for environmental  allergies.  Neurological: Negative for dizziness, tremors, numbness and headaches.  Hematological: Negative for adenopathy. Does not bruise/bleed easily.  Psychiatric/Behavioral: Negative for behavioral problems (Depression), sleep disturbance and suicidal ideas. The patient is nervous/anxious.        Very mild, intermittent anxiety. Takes alprazolam if needed at night.     Vital Signs: BP 139/75 (BP Location: Right Arm, Patient Position: Sitting, Cuff Size: Normal)   Pulse 76   Resp 16   Ht 5\' 7"  (1.702 m)   Wt 124 lb 12.8 oz (56.6 kg)   SpO2 99%   BMI 19.55 kg/m    Physical Exam  Constitutional: She is oriented to person, place, and time. She appears well-developed and well-nourished.  HENT:  Head: Normocephalic and atraumatic.  Eyes: Pupils are equal, round, and reactive to light. Conjunctivae and EOM are normal.  Neck: Normal range of motion. Neck supple. No JVD present. No thyromegaly present.  No carotid bruit, bilaterally.   Cardiovascular: Normal rate, regular rhythm and normal heart sounds.  Pulmonary/Chest: Effort normal and breath sounds normal. No respiratory distress. She has no wheezes.  Abdominal: Soft. Bowel sounds are normal. There is no tenderness.  Musculoskeletal: Normal range of motion.  Mild, generalized joint tenderness present. ROM and strength intact. No joint deformities or abnormalities are noted at this time.   Neurological: She is alert and oriented to person, place, and time.  Skin: Skin is warm and dry.  Psychiatric: She has a normal mood and affect. Her behavior is normal. Judgment and thought content normal.  Nursing note and vitals reviewed.  Assessment/Plan: 1. Primary generalized (osteo)arthritis Will continue to take tumeric every day. Will use OTC tylenol if needed for more severe joint pain. Will check connective tissue labs when routine labs checked.   2. Gastroesophageal reflux disease without esophagitis Doing well. Continue  pantoprazole as needed and as prescribed  - pantoprazole (PROTONIX) 40 MG tablet; Take 1 tablet (40 mg total) by mouth daily.  Dispense: 30 tablet; Refill: 5  3. Insomnia, unspecified type May take alprazolam 0.25mg  at bedtime as needed and as prescribed. A new prescription was sent to her pharmacy.  - ALPRAZolam (XANAX) 0.25 MG tablet; Take 1 tablet (0.25 mg total) by mouth at bedtime as needed for anxiety.  Dispense: 30 tablet; Refill: 3  General Counseling: Heela verbalizes understanding of the findings of todays visit and agrees with plan of treatment. I have discussed any further diagnostic evaluation that may be needed or ordered today. We also reviewed her medications today. she has been encouraged to call the office with any questions or concerns that should arise related to todays visit.  This patient was seen by Leretha Pol, FNP- C in Collaboration with Dr Lavera Guise as a part of collaborative care agreement    Meds ordered this encounter  Medications  . ALPRAZolam (XANAX) 0.25 MG tablet    Sig: Take 1 tablet (0.25 mg total) by mouth at bedtime as needed for anxiety.    Dispense:  30 tablet    Refill:  3    Order Specific Question:   Supervising Provider    Answer:   Lavera Guise [[id number]]  . pantoprazole (PROTONIX) 40 MG tablet    Sig: Take 1 tablet (40 mg total) by mouth daily.    Dispense:  30 tablet    Refill:  5    Order Specific Question:   Supervising Provider    Answer:   Lavera Guise [[id number]]    Time spent: 81 Minutes      Dr Lavera Guise Internal medicine

## 2018-04-04 ENCOUNTER — Encounter: Payer: Self-pay | Admitting: Nurse Practitioner

## 2018-04-04 ENCOUNTER — Ambulatory Visit: Payer: BLUE CROSS/BLUE SHIELD | Admitting: Nurse Practitioner

## 2018-04-04 VITALS — BP 127/81 | HR 66 | Resp 16 | Ht 67.0 in | Wt 120.0 lb

## 2018-04-04 DIAGNOSIS — E559 Vitamin D deficiency, unspecified: Secondary | ICD-10-CM

## 2018-04-04 DIAGNOSIS — Z0001 Encounter for general adult medical examination with abnormal findings: Secondary | ICD-10-CM

## 2018-04-04 DIAGNOSIS — G4700 Insomnia, unspecified: Secondary | ICD-10-CM | POA: Diagnosis not present

## 2018-04-04 DIAGNOSIS — M064 Inflammatory polyarthropathy: Secondary | ICD-10-CM

## 2018-04-04 DIAGNOSIS — R7301 Impaired fasting glucose: Secondary | ICD-10-CM

## 2018-04-04 DIAGNOSIS — Z1239 Encounter for other screening for malignant neoplasm of breast: Secondary | ICD-10-CM

## 2018-04-04 DIAGNOSIS — Z1231 Encounter for screening mammogram for malignant neoplasm of breast: Secondary | ICD-10-CM

## 2018-04-04 DIAGNOSIS — K219 Gastro-esophageal reflux disease without esophagitis: Secondary | ICD-10-CM

## 2018-04-04 MED ORDER — ALPRAZOLAM 0.25 MG PO TABS: 0.2500 mg | ORAL_TABLET | Freq: Every evening | ORAL | 3 refills | Status: DC | PRN

## 2018-04-04 MED ORDER — PANTOPRAZOLE SODIUM 40 MG PO TBEC: 40.0000 mg | DELAYED_RELEASE_TABLET | Freq: Every day | ORAL | 5 refills | Status: DC

## 2018-04-04 NOTE — Progress Notes (Signed)
[facility] [place], [place] [address]  Internal MEDICINE  Office Visit Note  Patient Name: Sarah Clarke  [id number]  [id number]  Date of Service: 04/12/2018   Pt is here for routine health maintenance examination   Chief Complaint  Patient presents with  . Gastroesophageal Reflux  . Annual Exam     The patient present with no acute concerns or complaints. She does take alprazolam 0.25mg  at bedtime to help her sleep. Tries not to take at all unless needed. She also takes protonix to reduce issues related to acid reflux. As long as she takes it, she does well. She is due to have routine blood work as well as mammogram ordered.      Current Medication: Outpatient Encounter Medications as of 04/04/2018  Medication Sig  . ALPRAZolam (XANAX) 0.25 MG tablet Take 1 tablet (0.25 mg total) by mouth at bedtime as needed for anxiety.  Marland Kitchen ibuprofen (ADVIL,MOTRIN) 600 MG tablet Take by mouth.  . pantoprazole (PROTONIX) 40 MG tablet Take 1 tablet (40 mg total) by mouth daily.  . [DISCONTINUED] ALPRAZolam (XANAX) 0.25 MG tablet Take 1 tablet (0.25 mg total) by mouth at bedtime as needed for anxiety.  . [DISCONTINUED] pantoprazole (PROTONIX) 40 MG tablet Take 1 tablet (40 mg total) by mouth daily.  . [DISCONTINUED] chlorpheniramine-HYDROcodone (TUSSIONEX PENNKINETIC ER) 10-8 MG/5ML SUER Take 5 mLs by mouth 2 (two) times daily. (Patient not taking: Reported on 04/04/2018)  . [DISCONTINUED] cyclobenzaprine (FLEXERIL) 5 MG tablet Take 1 tablet (5 mg total) by mouth 3 (three) times daily as needed for muscle spasms. (Patient not taking: Reported on 04/04/2018)  . [DISCONTINUED] diazepam (VALIUM) 5 MG tablet Take 1 tablet (5 mg total) by mouth every 12 (twelve) hours as needed. (Patient not taking: Reported on 04/04/2018)  . [DISCONTINUED] nabumetone (RELAFEN) 750 MG tablet Take 1 tablet (750 mg total) by mouth 2 (two) times daily. (Patient not taking: Reported on 04/04/2018)  .  [DISCONTINUED] ondansetron (ZOFRAN ODT) 4 MG disintegrating tablet Take 1 tablet (4 mg total) by mouth every 8 (eight) hours as needed. (Patient not taking: Reported on 04/04/2018)  . [DISCONTINUED] ondansetron (ZOFRAN) 4 MG tablet Take by mouth.  . [DISCONTINUED] oxyCODONE-acetaminophen (ROXICET) 5-325 MG tablet Take 1 tablet by mouth every 8 (eight) hours as needed. (Patient not taking: Reported on 04/04/2018)   No facility-administered encounter medications on file as of 04/04/2018.     Surgical History: Past Surgical History:  Procedure Laterality Date  . ABDOMINAL HYSTERECTOMY     Partial  . BACK SURGERY    . COLON SURGERY    . COLONOSCOPY WITH PROPOFOL N/A 07/14/2017   Procedure: COLONOSCOPY WITH PROPOFOL;  Surgeon: Jonathon Bellows, MD;  Location: [facility] ENDOSCOPY;  Service: Gastroenterology;  Laterality: N/A;  . LAPAROTOMY N/A 12/29/2014   Procedure: EXPLORATORY LAPAROTOMY with right hemicolectomy ;  Surgeon: Marlyce Huge, MD;  Location: [facility] ORS;  Service: General;  Laterality: N/A;  . neck fusion      Medical History: Past Medical History:  Diagnosis Date  . Anxiety   . Arthritis     Family History: Family History  Problem Relation Age of Onset  . Diabetes Mother   . Hypertension Father       Review of Systems  Constitutional: Negative for activity change, chills, fatigue and unexpected weight change.  HENT: Negative for congestion, postnasal drip, rhinorrhea, sneezing and sore throat.   Eyes: Negative.  Negative for redness.  Respiratory: Negative for cough, chest tightness and  shortness of breath.   Cardiovascular: Negative for chest pain and palpitations.  Gastrointestinal: Negative for abdominal pain, constipation, diarrhea, nausea and vomiting.  Endocrine: Negative for cold intolerance, heat intolerance, polydipsia, polyphagia and polyuria.  Genitourinary: Negative for dysuria, frequency, pelvic pain and urgency.  Musculoskeletal: Positive for arthralgias.  Negative for back pain, joint swelling and neck pain.       Multiple joints, especially elbows.   Skin: Negative for rash.  Allergic/Immunologic: Negative for environmental allergies.  Neurological: Negative for dizziness, tremors, numbness and headaches.  Hematological: Negative for adenopathy. Does not bruise/bleed easily.  Psychiatric/Behavioral: Positive for sleep disturbance. Negative for behavioral problems (Depression) and suicidal ideas. The patient is nervous/anxious.        Very mild, intermittent anxiety. Takes alprazolam if needed at night.     Today's Vitals   04/04/18 1001  BP: 127/81  Pulse: 66  Resp: 16  SpO2: 100%  Weight: 120 lb (54.4 kg)  Height: 5\' 7"  (1.702 m)    Physical Exam  Constitutional: She is oriented to person, place, and time. She appears well-developed and well-nourished.  HENT:  Head: Normocephalic and atraumatic.  Nose: Nose normal.  Mouth/Throat: Oropharynx is clear and moist.  Eyes: Pupils are equal, round, and reactive to light. Conjunctivae and EOM are normal.  Neck: Normal range of motion. Neck supple. No JVD present. Carotid bruit is not present. No tracheal deviation present. No thyromegaly present.  Cardiovascular: Normal rate, regular rhythm, normal heart sounds and intact distal pulses.  Pulmonary/Chest: Effort normal and breath sounds normal. No respiratory distress. She has no wheezes.  Abdominal: Soft. Bowel sounds are normal. There is no tenderness.  Musculoskeletal: Normal range of motion.  Mild, generalized joint tenderness present. ROM and strength intact. No joint deformities or abnormalities are noted at this time.   Lymphadenopathy:    She has no cervical adenopathy.  Neurological: She is alert and oriented to person, place, and time.  Skin: Skin is warm and dry. Capillary refill takes less than 2 seconds.  Psychiatric: She has a normal mood and affect. Her behavior is normal. Judgment and thought content normal.  Nursing  note and vitals reviewed.    LABS: Recent Results (from the past 2160 hour(s))  UA/M w/rflx Culture, Routine     Status: None   Collection Time: 04/04/18  9:52 AM  Result Value Ref Range   Specific Gravity, UA 1.015 1.005 - 1.030   pH, UA 6.5 5.0 - 7.5   Color, UA Yellow Yellow   Appearance Ur Clear Clear   Leukocytes, UA Negative Negative   Protein, UA Negative Negative/Trace   Glucose, UA Negative Negative   Ketones, UA Negative Negative   RBC, UA Negative Negative   Bilirubin, UA Negative Negative   Urobilinogen, Ur 0.2 0.2 - 1.0 mg/dL   Nitrite, UA Negative Negative   Microscopic Examination Comment     Comment: Microscopic follows if indicated.   Microscopic Examination See below:     Comment: Microscopic was indicated and was performed.   Urinalysis Reflex Comment     Comment: This specimen will not reflex to a Urine Culture.  Microscopic Examination     Status: Abnormal   Collection Time: 04/04/18  9:52 AM  Result Value Ref Range   WBC, UA 0-5 0 - 5 /hpf   RBC, UA 0-2 0 - 2 /hpf   Epithelial Cells (non renal) None seen 0 - 10 /hpf   Casts Present (A) None seen /lpf   Cast  Type Hyaline casts N/A   Bacteria, UA None seen None seen/Few  CBC with Differential/Platelet     Status: Abnormal   Collection Time: 04/05/18 10:13 AM  Result Value Ref Range   WBC 3.5 3.4 - 10.8 x10E3/uL   RBC 4.20 3.77 - 5.28 x10E6/uL   Hemoglobin 10.5 (L) 11.1 - 15.9 g/dL   Hematocrit 31.7 (L) 34.0 - 46.6 %   MCV 76 (L) 79 - 97 fL   MCH 25.0 (L) 26.6 - 33.0 pg   MCHC 33.1 31.5 - 35.7 g/dL   RDW 15.5 (H) 12.3 - 15.4 %   Platelets 405 150 - 450 x10E3/uL   Neutrophils 44 Not Estab. %   Lymphs 31 Not Estab. %   Monocytes 17 Not Estab. %   Eos 7 Not Estab. %   Basos 1 Not Estab. %   Neutrophils Absolute 1.6 1.4 - 7.0 x10E3/uL   Lymphocytes Absolute 1.1 0.7 - 3.1 x10E3/uL   Monocytes Absolute 0.6 0.1 - 0.9 x10E3/uL   EOS (ABSOLUTE) 0.2 0.0 - 0.4 x10E3/uL   Basophils Absolute 0.1 0.0 -  0.2 x10E3/uL   Immature Granulocytes 0 Not Estab. %   Immature Grans (Abs) 0.0 0.0 - 0.1 x10E3/uL  Comprehensive metabolic panel     Status: None   Collection Time: 04/05/18 10:13 AM  Result Value Ref Range   Glucose 86 65 - 99 mg/dL   BUN 8 6 - 24 mg/dL   Creatinine, Ser 0.90 0.57 - 1.00 mg/dL   GFR calc non Af Amer 71 >59 mL/min/1.73   GFR calc Af Amer 82 >59 mL/min/1.73   BUN/Creatinine Ratio 9 9 - 23   Sodium 138 134 - 144 mmol/L   Potassium 4.9 3.5 - 5.2 mmol/L   Chloride 99 96 - 106 mmol/L   CO2 23 20 - 29 mmol/L   Calcium 9.4 8.7 - 10.2 mg/dL   Total Protein 7.2 6.0 - 8.5 g/dL   Albumin 4.7 3.5 - 5.5 g/dL   Globulin, Total 2.5 1.5 - 4.5 g/dL   Albumin/Globulin Ratio 1.9 1.2 - 2.2   Bilirubin Total <0.2 0.0 - 1.2 mg/dL   Alkaline Phosphatase 60 39 - 117 IU/L   AST 22 0 - 40 IU/L   ALT 10 0 - 32 IU/L  T4, free     Status: None   Collection Time: 04/05/18 10:13 AM  Result Value Ref Range   Free T4 0.98 0.82 - 1.77 ng/dL  TSH     Status: None   Collection Time: 04/05/18 10:13 AM  Result Value Ref Range   TSH 0.867 0.450 - 4.500 uIU/mL  Lipid panel     Status: Abnormal   Collection Time: 04/05/18 10:13 AM  Result Value Ref Range   Cholesterol, Total 250 (H) 100 - 199 mg/dL   Triglycerides 94 0 - 149 mg/dL   HDL 140 >39 mg/dL   VLDL Cholesterol Cal 19 5 - 40 mg/dL   LDL Calculated 91 0 - 99 mg/dL   Chol/HDL Ratio 1.8 0.0 - 4.4 ratio    Comment:                                   T. Chol/HDL Ratio  Men  Women                               1/2 Avg.Risk  3.4    3.3                                   Avg.Risk  5.0    4.4                                2X Avg.Risk  9.6    7.1                                3X Avg.Risk 23.4   11.0   Vitamin D 1,25 dihydroxy     Status: None   Collection Time: 04/05/18 10:13 AM  Result Value Ref Range   Vitamin D 1, 25 (OH)2 Total 33 pg/mL    Comment: Reference Range: Adults: 21 - 65     Vitamin D2 1, 25 (OH)2 <10 pg/mL   Vitamin D3 1, 25 (OH)2 33 pg/mL  ANA w/Reflex if Positive     Status: None   Collection Time: 04/05/18 10:13 AM  Result Value Ref Range   Anti Nuclear Antibody(ANA) Negative Negative  Rheumatoid factor     Status: None   Collection Time: 04/05/18 10:13 AM  Result Value Ref Range   Rhuematoid fact SerPl-aCnc <10.0 0.0 - 13.9 IU/mL  Sedimentation rate     Status: None   Collection Time: 04/05/18 10:13 AM  Result Value Ref Range   Sed Rate 2 0 - 40 mm/hr  Uric acid     Status: None   Collection Time: 04/05/18 10:13 AM  Result Value Ref Range   Uric Acid 4.8 2.5 - 7.1 mg/dL    Comment:            Therapeutic target for gout patients: <6.0  Hgb A1c w/o eAG     Status: None   Collection Time: 04/05/18 10:13 AM  Result Value Ref Range   Hgb A1c MFr Bld 5.4 4.8 - 5.6 %    Comment:          Prediabetes: 5.7 - 6.4          Diabetes: >6.4          Glycemic control for adults with diabetes: <7.0   T3     Status: None   Collection Time: 04/05/18 10:13 AM  Result Value Ref Range   T3, Total 94 71 - 180 ng/dL   Assessment/Plan: 1. Encounter for general adult medical examination with abnormal findings Annual health maintenance exam today.  - UA/M w/rflx Culture, Routine - CBC with Differential/Platelet - Comprehensive metabolic panel - T4, free - TSH - Lipid panel  2. Inflammatory polyarthropathy ([place]) Check labs for further evaluation and treatment.  - ANA w/Reflex if Positive - Rheumatoid factor - Sedimentation rate - Uric acid  3. Vitamin D deficiency - Vitamin D 1,25 dihydroxy  4. Insomnia, unspecified type May continue alprazolam 0.25mg  at bedtime as needed for insomnia. New rx provided today. - ALPRAZolam (XANAX) 0.25 MG tablet; Take 1 tablet (0.25 mg total) by mouth at bedtime as needed for anxiety.  Dispense: 30 tablet; Refill: 3  5. Gastroesophageal reflux disease without  esophagitis - pantoprazole (PROTONIX) 40 MG tablet; Take 1  tablet (40 mg total) by mouth daily.  Dispense: 30 tablet; Refill: 5  6. Impaired fasting glucose - HgB A1c  7. Screening for breast cancer - MM DIGITAL SCREENING BILATERAL; Future  General Counseling: Cyana verbalizes understanding of the findings of todays visit and agrees with plan of treatment. I have discussed any further diagnostic evaluation that may be needed or ordered today. We also reviewed her medications today. she has been encouraged to call the office with any questions or concerns that should arise related to todays visit.    Counseling:  This patient was seen by Leretha Pol FNP Collaboration with Dr Lavera Guise as a part of collaborative care agreement  Orders Placed This Encounter  Procedures  . Microscopic Examination  . MM DIGITAL SCREENING BILATERAL  . UA/M w/rflx Culture, Routine  . CBC with Differential/Platelet  . Comprehensive metabolic panel  . T4, free  . TSH  . Lipid panel  . Vitamin D 1,25 dihydroxy  . ANA w/Reflex if Positive  . Rheumatoid factor  . Sedimentation rate  . Uric acid  . HgB A1c  . Hgb A1c w/o eAG  . T3    Meds ordered this encounter  Medications  . ALPRAZolam (XANAX) 0.25 MG tablet    Sig: Take 1 tablet (0.25 mg total) by mouth at bedtime as needed for anxiety.    Dispense:  30 tablet    Refill:  3    Order Specific Question:   Supervising Provider    Answer:   Lavera Guise [[id number]]  . pantoprazole (PROTONIX) 40 MG tablet    Sig: Take 1 tablet (40 mg total) by mouth daily.    Dispense:  30 tablet    Refill:  5    Order Specific Question:   Supervising Provider    Answer:   Lavera Guise [[id number]]    Time spent: [place], MD  Internal Medicine

## 2018-04-05 LAB — MICROSCOPIC EXAMINATION
BACTERIA UA: NONE SEEN
Epithelial Cells (non renal): NONE SEEN /hpf (ref 0–10)

## 2018-04-05 LAB — UA/M W/RFLX CULTURE, ROUTINE
Bilirubin, UA: NEGATIVE
Glucose, UA: NEGATIVE
Ketones, UA: NEGATIVE
Leukocytes, UA: NEGATIVE
Nitrite, UA: NEGATIVE
PH UA: 6.5 (ref 5.0–7.5)
PROTEIN UA: NEGATIVE
RBC, UA: NEGATIVE
SPEC GRAV UA: 1.015 (ref 1.005–1.030)
Urobilinogen, Ur: 0.2 mg/dL (ref 0.2–1.0)

## 2018-04-10 LAB — COMPREHENSIVE METABOLIC PANEL
ALBUMIN: 4.7 g/dL (ref 3.5–5.5)
ALT: 10 IU/L (ref 0–32)
AST: 22 IU/L (ref 0–40)
Albumin/Globulin Ratio: 1.9 (ref 1.2–2.2)
Alkaline Phosphatase: 60 IU/L (ref 39–117)
BUN/Creatinine Ratio: 9 (ref 9–23)
BUN: 8 mg/dL (ref 6–24)
Bilirubin Total: <0.2 mg/dL (ref 0.0–1.2)
CALCIUM: 9.4 mg/dL (ref 8.7–10.2)
CO2: 23 mmol/L (ref 20–29)
Chloride: 99 mmol/L (ref 96–106)
Creatinine, Ser: 0.9 mg/dL (ref 0.57–1.00)
GFR calc Af Amer: 82 mL/min/{1.73_m2} (ref >59)
GFR, EST NON AFRICAN AMERICAN: 71 mL/min/{1.73_m2} (ref >59)
GLOBULIN, TOTAL: 2.5 g/dL (ref 1.5–4.5)
Glucose: 86 mg/dL (ref 65–99)
POTASSIUM: 4.9 mmol/L (ref 3.5–5.2)
Sodium: 138 mmol/L (ref 134–144)
TOTAL PROTEIN: 7.2 g/dL (ref 6.0–8.5)

## 2018-04-10 LAB — LIPID PANEL
CHOLESTEROL TOTAL: 250 mg/dL — AB (ref 100–199)
Chol/HDL Ratio: 1.8 ratio (ref 0.0–4.4)
HDL: 140 mg/dL (ref >39)
LDL Calculated: 91 mg/dL (ref 0–99)
Triglycerides: 94 mg/dL (ref 0–149)
VLDL Cholesterol Cal: 19 mg/dL (ref 5–40)

## 2018-04-10 LAB — CBC WITH DIFFERENTIAL/PLATELET
BASOS: 1 %
Basophils Absolute: 0.1 10*3/uL (ref 0.0–0.2)
EOS (ABSOLUTE): 0.2 10*3/uL (ref 0.0–0.4)
EOS: 7 %
HEMATOCRIT: 31.7 % — AB (ref 34.0–46.6)
HEMOGLOBIN: 10.5 g/dL — AB (ref 11.1–15.9)
IMMATURE GRANULOCYTES: 0 %
Immature Grans (Abs): 0 10*3/uL (ref 0.0–0.1)
Lymphocytes Absolute: 1.1 10*3/uL (ref 0.7–3.1)
Lymphs: 31 %
MCH: 25 pg — ABNORMAL LOW (ref 26.6–33.0)
MCHC: 33.1 g/dL (ref 31.5–35.7)
MCV: 76 fL — ABNORMAL LOW (ref 79–97)
Monocytes Absolute: 0.6 10*3/uL (ref 0.1–0.9)
Monocytes: 17 %
NEUTROS PCT: 44 %
Neutrophils Absolute: 1.6 10*3/uL (ref 1.4–7.0)
Platelets: 405 10*3/uL (ref 150–450)
RBC: 4.2 x10E6/uL (ref 3.77–5.28)
RDW: 15.5 % — ABNORMAL HIGH (ref 12.3–15.4)
WBC: 3.5 10*3/uL (ref 3.4–10.8)

## 2018-04-10 LAB — VITAMIN D 1,25 DIHYDROXY
VITAMIN D 1, 25 (OH) TOTAL: 33 pg/mL
VITAMIN D3 1, 25 (OH): 33 pg/mL

## 2018-04-10 LAB — T4, FREE: Free T4: 0.98 ng/dL (ref 0.82–1.77)

## 2018-04-10 LAB — TSH: TSH: 0.867 u[IU]/mL (ref 0.450–4.500)

## 2018-04-10 LAB — T3: T3 TOTAL: 94 ng/dL (ref 71–180)

## 2018-04-10 LAB — ANA W/REFLEX IF POSITIVE: ANA: NEGATIVE

## 2018-04-10 LAB — RHEUMATOID FACTOR

## 2018-04-10 LAB — HGB A1C W/O EAG: HEMOGLOBIN A1C: 5.4 % (ref 4.8–5.6)

## 2018-04-10 LAB — SEDIMENTATION RATE: Sed Rate: 2 mm/hr (ref 0–40)

## 2018-04-10 LAB — URIC ACID: Uric Acid: 4.8 mg/dL (ref 2.5–7.1)

## 2018-04-12 DIAGNOSIS — Z1239 Encounter for other screening for malignant neoplasm of breast: Secondary | ICD-10-CM | POA: Insufficient documentation

## 2018-04-12 DIAGNOSIS — E559 Vitamin D deficiency, unspecified: Secondary | ICD-10-CM | POA: Insufficient documentation

## 2018-04-12 DIAGNOSIS — M064 Inflammatory polyarthropathy: Secondary | ICD-10-CM | POA: Insufficient documentation

## 2018-04-12 DIAGNOSIS — R7301 Impaired fasting glucose: Secondary | ICD-10-CM | POA: Insufficient documentation

## 2018-05-03 ENCOUNTER — Telehealth: Payer: Self-pay | Admitting: Nurse Practitioner

## 2018-05-03 NOTE — Telephone Encounter (Signed)
Left message for patient to call me back regarding her labs, will send prudent diet in the mail for patient to review for cholesterol.

## 2018-06-10 IMAGING — CR DG CHEST 2V
1 series · 2 of 2 positions shown · non-contrast
Comparison: Portable chest x-ray December 28, 2014

CLINICAL DATA: Flu syndrome 10 days ago with persistent cough chest
congestion, fever, and chest pain.

EXAM:
CHEST  2 VIEW

[Series 1: dg chest 2 view · 0.14mm/px · 2 of 2 slices shown]
[im 1/2]
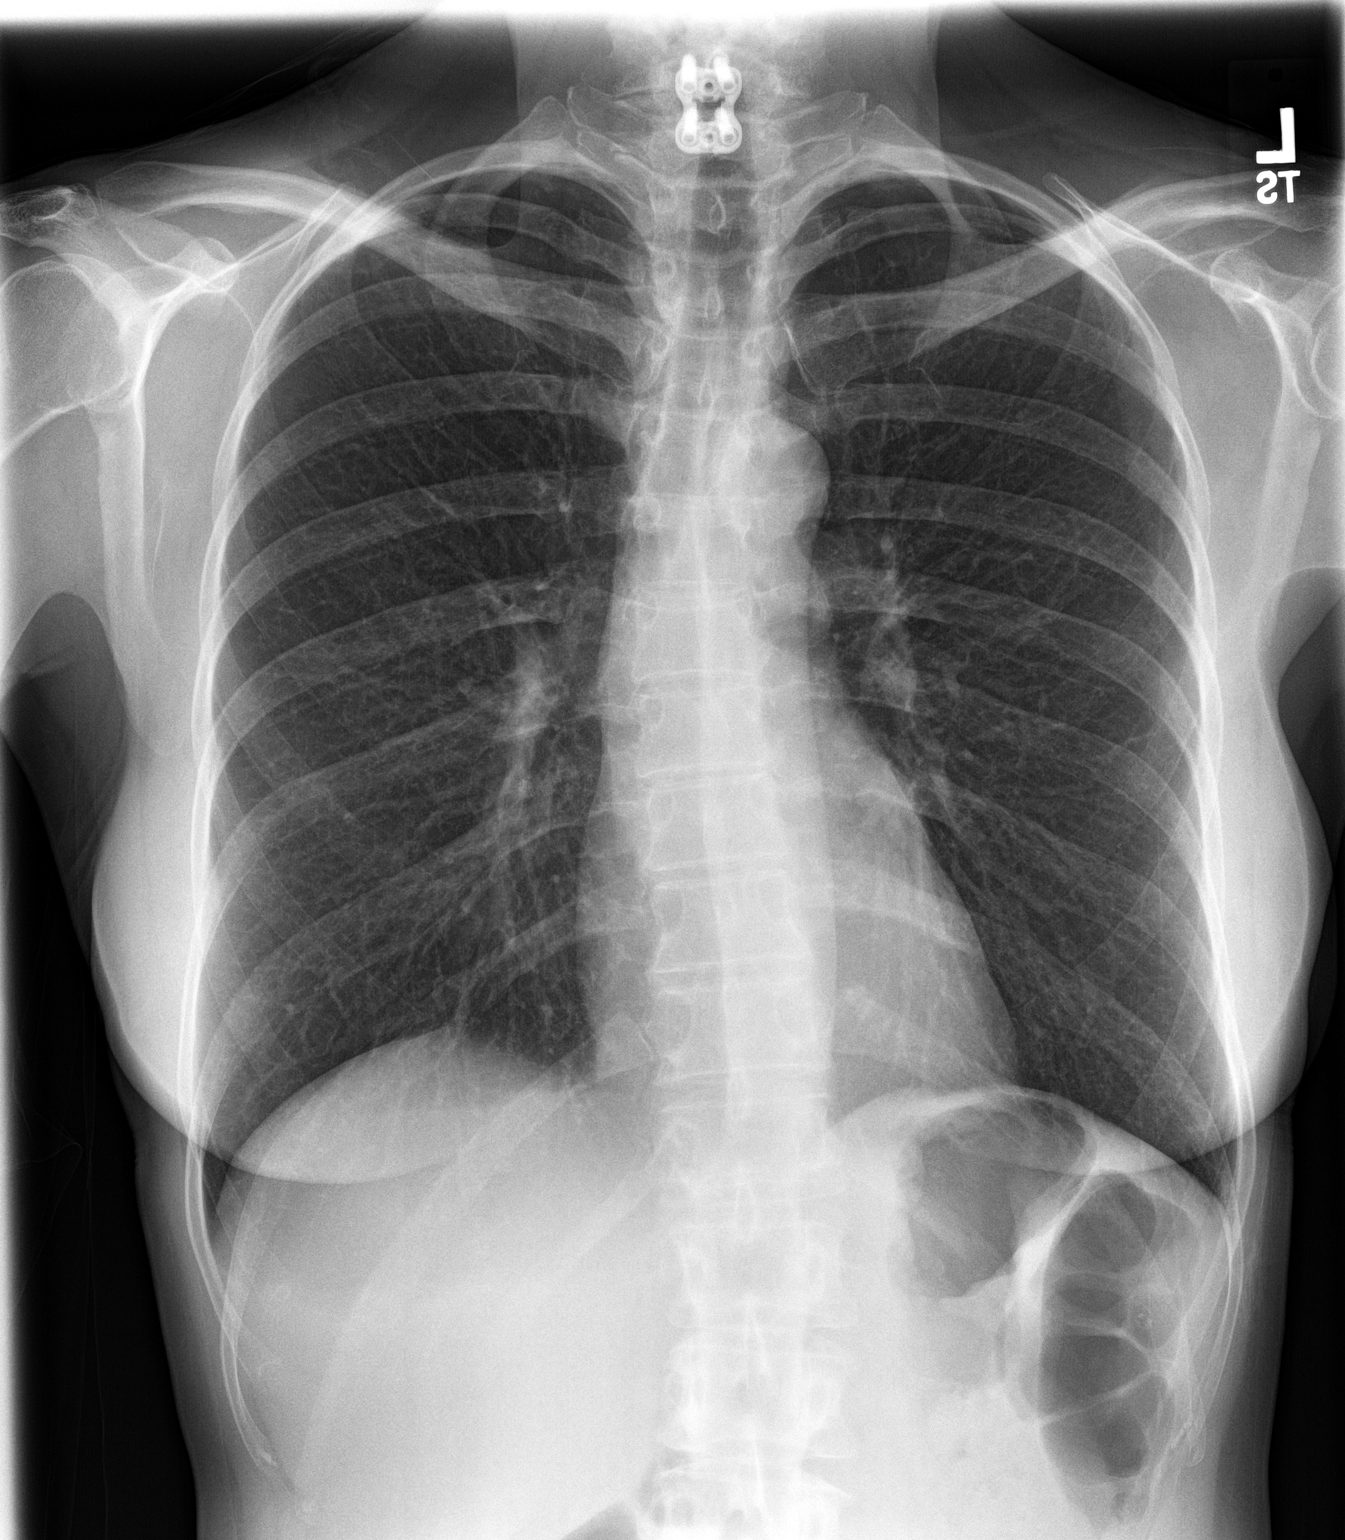
[im 2/2]
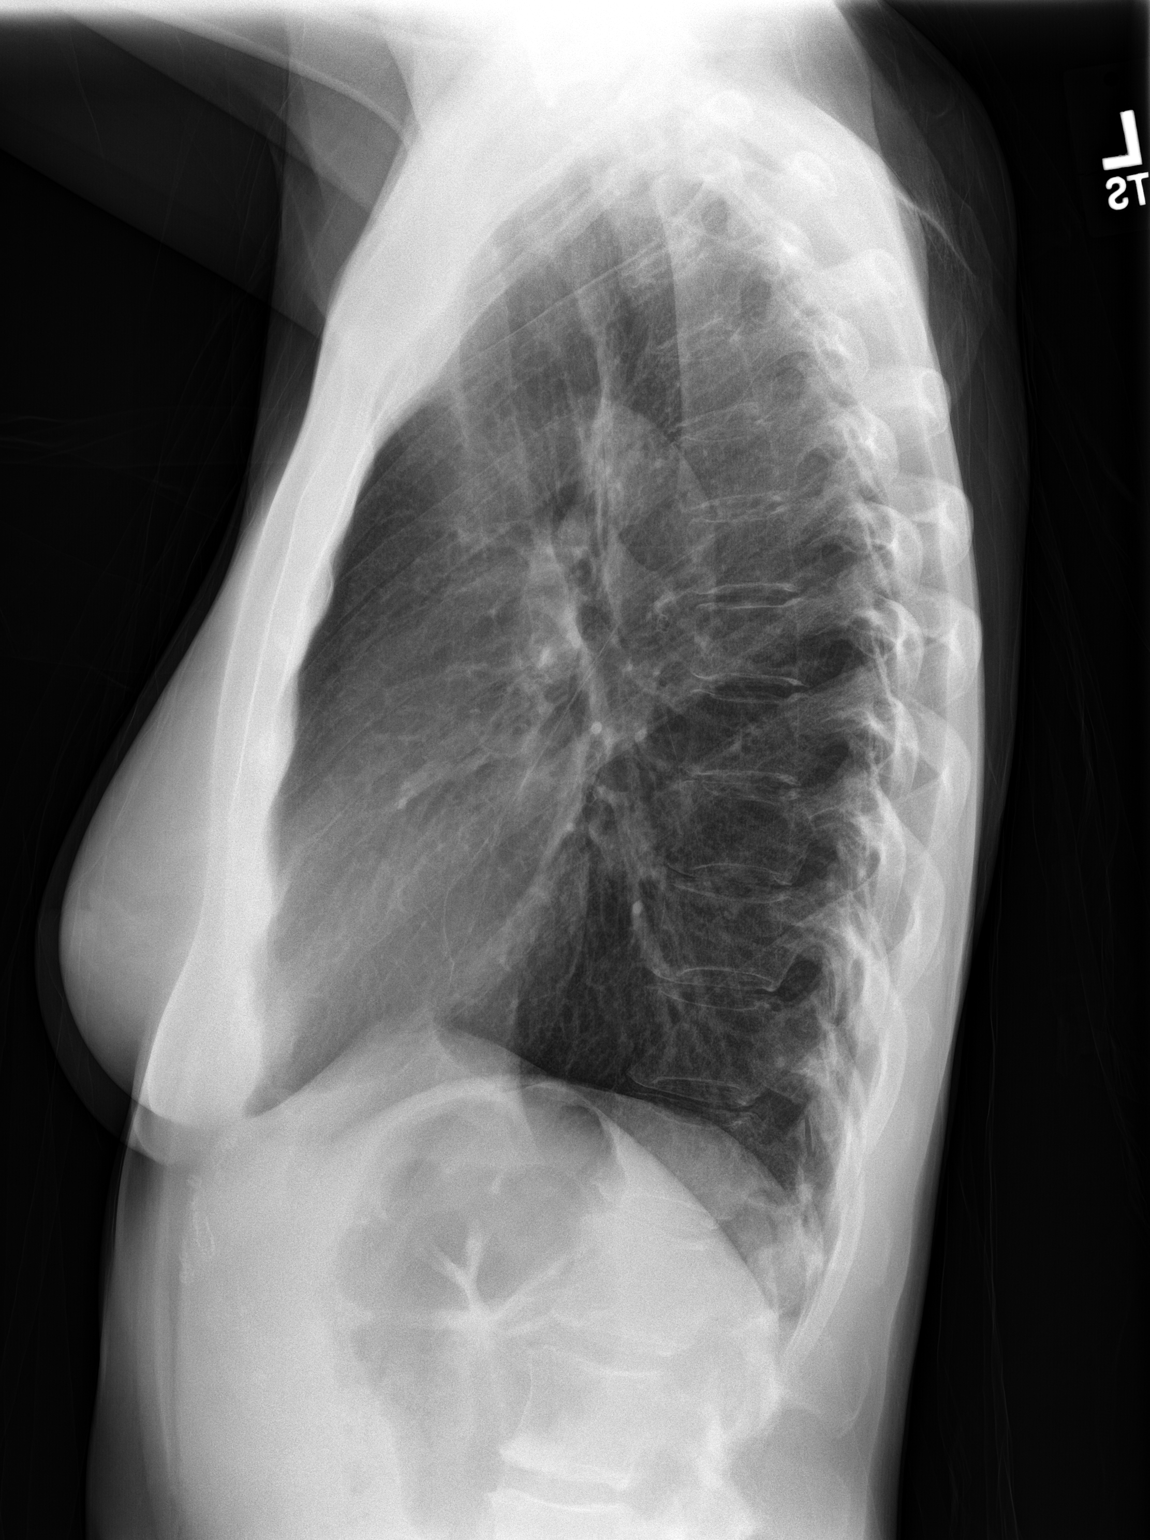

[2 of 2 positions shown; findings below may reference images not displayed]

FINDINGS: The lungs are mildly hyperinflated and clear. The heart and
pulmonary vascularity are normal. The mediastinum is normal in
width. There is no pleural effusion. There is stable gentle S shaped
thoracolumbar scoliosis.
IMPRESSION: Mild hyperinflation may be voluntary or may reflect residual air
trapping. There is no pneumonia, CHF, nor other acute
cardiopulmonary abnormality.

## 2018-08-01 ENCOUNTER — Encounter: Payer: Self-pay | Admitting: Nurse Practitioner

## 2018-08-01 ENCOUNTER — Ambulatory Visit: Payer: BLUE CROSS/BLUE SHIELD | Admitting: Nurse Practitioner

## 2018-08-01 VITALS — BP 150/88 | HR 79 | Resp 16 | Ht 67.0 in | Wt 123.0 lb

## 2018-08-01 DIAGNOSIS — K219 Gastro-esophageal reflux disease without esophagitis: Secondary | ICD-10-CM | POA: Diagnosis not present

## 2018-08-01 DIAGNOSIS — M064 Inflammatory polyarthropathy: Secondary | ICD-10-CM

## 2018-08-01 DIAGNOSIS — G4700 Insomnia, unspecified: Secondary | ICD-10-CM | POA: Diagnosis not present

## 2018-08-01 DIAGNOSIS — D649 Anemia, unspecified: Secondary | ICD-10-CM | POA: Insufficient documentation

## 2018-08-01 DIAGNOSIS — D508 Other iron deficiency anemias: Secondary | ICD-10-CM

## 2018-08-01 MED ORDER — ALPRAZOLAM 0.25 MG PO TABS: 0.2500 mg | ORAL_TABLET | Freq: Every evening | ORAL | 3 refills | Status: DC | PRN

## 2018-08-01 NOTE — Progress Notes (Signed)
[facility] [place], [place] [address]  Internal MEDICINE  Office Visit Note  Patient Name: Sarah Clarke  [id number]  [id number]  Date of Service: 08/01/2018  Chief Complaint  Patient presents with  . Anxiety  . Gastroesophageal Reflux    The patient is here for routine follow up visit. Blood pressure slightly elevated today. Had some issues with insurance when she came into the office. No longer in network but was unaware of the changes her insurance had made. Upsetting to her. Does not want to change doctors based on insurance bias.  Routine labs were done in 03/2018. She had mildly low Hgb and Hct. She also had mildly elevated cholesterol. She feels good, overall, and has no concerns or complaints to bring up today.       Current Medication: Outpatient Encounter Medications as of 08/01/2018  Medication Sig  . ALPRAZolam (XANAX) 0.25 MG tablet Take 1 tablet (0.25 mg total) by mouth at bedtime as needed for anxiety.  Marland Kitchen ibuprofen (ADVIL,MOTRIN) 600 MG tablet Take by mouth.  . pantoprazole (PROTONIX) 40 MG tablet Take 1 tablet (40 mg total) by mouth daily.  . [DISCONTINUED] ALPRAZolam (XANAX) 0.25 MG tablet Take 1 tablet (0.25 mg total) by mouth at bedtime as needed for anxiety.   No facility-administered encounter medications on file as of 08/01/2018.     Surgical History: Past Surgical History:  Procedure Laterality Date  . ABDOMINAL HYSTERECTOMY     Partial  . BACK SURGERY    . COLON SURGERY    . COLONOSCOPY WITH PROPOFOL N/A 07/14/2017   Procedure: COLONOSCOPY WITH PROPOFOL;  Surgeon: Jonathon Bellows, MD;  Location: [facility] ENDOSCOPY;  Service: Gastroenterology;  Laterality: N/A;  . LAPAROTOMY N/A 12/29/2014   Procedure: EXPLORATORY LAPAROTOMY with right hemicolectomy ;  Surgeon: Marlyce Huge, MD;  Location: [facility] ORS;  Service: General;  Laterality: N/A;  . neck fusion      Medical History: Past Medical History:  Diagnosis Date  .  Anxiety   . Arthritis     Family History: Family History  Problem Relation Age of Onset  . Diabetes Mother   . Hypertension Father     Social History   Socioeconomic History  . Marital status: Divorced    Spouse name: Not on file  . Number of children: Not on file  . Years of education: Not on file  . Highest education level: Not on file  Occupational History  . Not on file  Social Needs  . Financial resource strain: Not on file  . Food insecurity:    Worry: Not on file    Inability: Not on file  . Transportation needs:    Medical: Not on file    Non-medical: Not on file  Tobacco Use  . Smoking status: Never Smoker  . Smokeless tobacco: Never Used  Substance and Sexual Activity  . Alcohol use: Yes    Comment: social  . Drug use: No  . Sexual activity: Not Currently  Lifestyle  . Physical activity:    Days per week: Not on file    Minutes per session: Not on file  . Stress: Not on file  Relationships  . Social connections:    Talks on phone: Not on file    Gets together: Not on file    Attends religious service: Not on file    Active member of club or organization: Not on file    Attends meetings of clubs or organizations: Not on file  Relationship status: Not on file  . Intimate partner violence:    Fear of current or ex partner: Not on file    Emotionally abused: Not on file    Physically abused: Not on file    Forced sexual activity: Not on file  Other Topics Concern  . Not on file  Social History Narrative  . Not on file      Review of Systems  Constitutional: Negative for activity change, chills, fatigue and unexpected weight change.  HENT: Negative for congestion, postnasal drip, rhinorrhea, sneezing and sore throat.   Eyes: Negative for redness.  Respiratory: Negative for cough, chest tightness and shortness of breath.   Cardiovascular: Negative for chest pain and palpitations.       Blood pressure elevated today.   Gastrointestinal:  Negative for abdominal pain, constipation, diarrhea, nausea and vomiting.       GERD  Endocrine: Negative for cold intolerance, heat intolerance, polydipsia and polyuria.  Musculoskeletal: Positive for arthralgias. Negative for back pain, joint swelling and neck pain.       Multiple joints, especially elbows.  Reviewed connective tissue panel which was normal.   Skin: Negative for rash.  Allergic/Immunologic: Negative for environmental allergies.  Neurological: Negative for dizziness, tremors, numbness and headaches.  Hematological: Negative for adenopathy. Does not bruise/bleed easily.  Psychiatric/Behavioral: Negative for behavioral problems (Depression), sleep disturbance and suicidal ideas. The patient is nervous/anxious.        Very mild, intermittent anxiety. Takes alprazolam if needed at night.    Today's Vitals   08/01/18 0958  BP: (!) 150/88  Pulse: 79  Resp: 16  SpO2: 99%  Weight: 123 lb (55.8 kg)  Height: 5\' 7"  (1.702 m)    Physical Exam Vitals signs and nursing note reviewed.  Constitutional:      Appearance: She is well-developed.  HENT:     Head: Normocephalic and atraumatic.  Eyes:     Conjunctiva/sclera: Conjunctivae normal.     Pupils: Pupils are equal, round, and reactive to light.  Neck:     Musculoskeletal: Normal range of motion and neck supple.     Thyroid: No thyromegaly.     Vascular: No carotid bruit or JVD.     Comments: No carotid bruit, bilaterally.  Cardiovascular:     Rate and Rhythm: Normal rate and regular rhythm.     Heart sounds: Normal heart sounds.  Pulmonary:     Effort: Pulmonary effort is normal. No respiratory distress.     Breath sounds: Normal breath sounds. No wheezing.  Abdominal:     General: Bowel sounds are normal.     Palpations: Abdomen is soft.     Tenderness: There is no abdominal tenderness.  Musculoskeletal: Normal range of motion.     Comments: Mild, generalized joint tenderness present. ROM and strength intact. No  joint deformities or abnormalities are noted at this time.   Skin:    General: Skin is warm and dry.  Neurological:     General: No focal deficit present.     Mental Status: She is alert and oriented to person, place, and time.  Psychiatric:        Mood and Affect: Mood is anxious.        Speech: Speech normal.        Behavior: Behavior normal.        Thought Content: Thought content normal.        Cognition and Memory: Cognition and memory normal.  Judgment: Judgment normal.    Assessment/Plan: 1. Gastroesophageal reflux disease without esophagitis Continue pantoprazole as needed and as prescribed   2. Insomnia, unspecified type May take alprazolam 0.25mg  at night as needed. A new prescription was sent to her pharmacy. - ALPRAZolam (XANAX) 0.25 MG tablet; Take 1 tablet (0.25 mg total) by mouth at bedtime as needed for anxiety.  Dispense: 30 tablet; Refill: 3  3. Inflammatory polyarthropathy ([place]) Reviewed connective tissue labs. All within normal limits. Improved with tumeric every day. Will monitor.   4. Other iron deficiency anemia Has been taking OTC iron supplement. Will recheck CBC and treat as indicated   General Counseling: Sarah Clarke verbalizes understanding of the findings of todays visit and agrees with plan of treatment. I have discussed any further diagnostic evaluation that may be needed or ordered today. We also reviewed her medications today. she has been encouraged to call the office with any questions or concerns that should arise related to todays visit.   This patient was seen by [place] with Dr Lavera Guise as a part of collaborative care agreement  Meds ordered this encounter  Medications  . ALPRAZolam (XANAX) 0.25 MG tablet    Sig: Take 1 tablet (0.25 mg total) by mouth at bedtime as needed for anxiety.    Dispense:  30 tablet    Refill:  3    Order Specific Question:   Supervising Provider    Answer:   Lavera Guise [[id number]]     Time spent: 12 Minutes      Dr Lavera Guise Internal medicine

## 2018-08-02 ENCOUNTER — Other Ambulatory Visit: Payer: Self-pay | Admitting: Nurse Practitioner

## 2018-08-03 LAB — CBC
HEMATOCRIT: 34 % (ref 34.0–46.6)
HEMOGLOBIN: 11 g/dL — AB (ref 11.1–15.9)
MCH: 26.4 pg — AB (ref 26.6–33.0)
MCHC: 32.4 g/dL (ref 31.5–35.7)
MCV: 82 fL (ref 79–97)
Platelets: 394 10*3/uL (ref 150–450)
RBC: 4.17 x10E6/uL (ref 3.77–5.28)
RDW: 17.1 % — ABNORMAL HIGH (ref 11.7–15.4)
WBC: 4.8 10*3/uL (ref 3.4–10.8)

## 2018-08-14 ENCOUNTER — Telehealth: Payer: Self-pay

## 2018-08-14 ENCOUNTER — Other Ambulatory Visit: Payer: Self-pay | Admitting: Nurse Practitioner

## 2018-08-14 DIAGNOSIS — J209 Acute bronchitis, unspecified: Secondary | ICD-10-CM

## 2018-08-14 MED ORDER — AMOXICILLIN-POT CLAVULANATE 875-125 MG PO TABS: 1.0000 | ORAL_TABLET | Freq: Two times a day (BID) | ORAL | 0 refills | Status: DC

## 2018-08-14 MED ORDER — FLUCONAZOLE 150 MG PO TABS: ORAL_TABLET | ORAL | 0 refills | Status: DC

## 2018-08-14 NOTE — Telephone Encounter (Signed)
PT WAS NOTIFIED. PT SAID WHEN TAKING AUGMENTIN SHE USUALLY GETS YEAST INFECTIONS AND IF YOU CAN SEND HER MEDICATION FOR THAT.

## 2018-08-14 NOTE — Progress Notes (Signed)
Sent prescription for augmentin 875mg  twice daily for 10 days. Rest and increase fluids. Should use OTC medications to help improve symptoms.

## 2018-08-14 NOTE — Telephone Encounter (Signed)
Sent prescription for augmentin 875mg  twice daily for 10 days. Rest and increase fluids. Should use OTC medications to help improve symptoms.

## 2018-08-15 ENCOUNTER — Other Ambulatory Visit: Payer: Self-pay | Admitting: Nurse Practitioner

## 2018-08-15 NOTE — Telephone Encounter (Signed)
We sent new prescription for diflucan to her pharmacy 08/14/2018

## 2018-08-16 NOTE — Telephone Encounter (Signed)
CALLED PT TO MAKE SURE SHE RECEIVED MED. PT CONFIRMED SHE DID.

## 2018-12-04 ENCOUNTER — Other Ambulatory Visit: Payer: Self-pay

## 2018-12-04 ENCOUNTER — Encounter: Payer: Self-pay | Admitting: Nurse Practitioner

## 2018-12-04 ENCOUNTER — Ambulatory Visit (INDEPENDENT_AMBULATORY_CARE_PROVIDER_SITE_OTHER): Payer: BLUE CROSS/BLUE SHIELD | Admitting: Nurse Practitioner

## 2018-12-04 VITALS — BP 145/86 | HR 75 | Resp 16 | Ht 67.0 in | Wt 125.4 lb

## 2018-12-04 DIAGNOSIS — K219 Gastro-esophageal reflux disease without esophagitis: Secondary | ICD-10-CM

## 2018-12-04 DIAGNOSIS — G4700 Insomnia, unspecified: Secondary | ICD-10-CM | POA: Diagnosis not present

## 2018-12-04 MED ORDER — ALPRAZOLAM 0.25 MG PO TABS: 0.2500 mg | ORAL_TABLET | Freq: Every evening | ORAL | 3 refills | Status: DC | PRN

## 2018-12-04 NOTE — Progress Notes (Signed)
[facility] [place], [place] [address]  Internal MEDICINE  Office Visit Note  Patient Name: Sarah Clarke  [id number]  [id number]  Date of Service: 12/04/2018  Chief Complaint  Patient presents with  . Anxiety    The patient present with no acute concerns or complaints. She does take alprazolam 0.25mg  at bedtime to help her sleep. Tries not to take at all unless needed. She also takes protonix to reduce issues related to acid reflux. As long as she takes it, she does well. She is due to have routine blood work as well as mammogram ordered.       Current Medication: Outpatient Encounter Medications as of 12/04/2018  Medication Sig  . ALPRAZolam (XANAX) 0.25 MG tablet Take 1 tablet (0.25 mg total) by mouth at bedtime as needed for anxiety.  . fluconazole (DIFLUCAN) 150 MG tablet Take one tablet by mouth, take another tablet in three days if symptoms persist  . ibuprofen (ADVIL,MOTRIN) 600 MG tablet Take by mouth.  . pantoprazole (PROTONIX) 40 MG tablet Take 1 tablet (40 mg total) by mouth daily.  . [DISCONTINUED] ALPRAZolam (XANAX) 0.25 MG tablet Take 1 tablet (0.25 mg total) by mouth at bedtime as needed for anxiety.  . [DISCONTINUED] amoxicillin-clavulanate (AUGMENTIN) 875-125 MG tablet Take 1 tablet by mouth 2 (two) times daily. (Patient not taking: Reported on 12/04/2018)   No facility-administered encounter medications on file as of 12/04/2018.     Surgical History: Past Surgical History:  Procedure Laterality Date  . ABDOMINAL HYSTERECTOMY     Partial  . BACK SURGERY    . COLON SURGERY    . COLONOSCOPY WITH PROPOFOL N/A 07/14/2017   Procedure: COLONOSCOPY WITH PROPOFOL;  Surgeon: Jonathon Bellows, MD;  Location: [facility] ENDOSCOPY;  Service: Gastroenterology;  Laterality: N/A;  . LAPAROTOMY N/A 12/29/2014   Procedure: EXPLORATORY LAPAROTOMY with right hemicolectomy ;  Surgeon: Marlyce Huge, MD;  Location: [facility] ORS;  Service: General;  Laterality:  N/A;  . neck fusion      Medical History: Past Medical History:  Diagnosis Date  . Anxiety   . Arthritis     Family History: Family History  Problem Relation Age of Onset  . Diabetes Mother   . Hypertension Father     Social History   Socioeconomic History  . Marital status: Divorced    Spouse name: Not on file  . Number of children: Not on file  . Years of education: Not on file  . Highest education level: Not on file  Occupational History  . Not on file  Social Needs  . Financial resource strain: Not on file  . Food insecurity:    Worry: Not on file    Inability: Not on file  . Transportation needs:    Medical: Not on file    Non-medical: Not on file  Tobacco Use  . Smoking status: Never Smoker  . Smokeless tobacco: Never Used  Substance and Sexual Activity  . Alcohol use: Yes    Comment: social  . Drug use: No  . Sexual activity: Not Currently  Lifestyle  . Physical activity:    Days per week: Not on file    Minutes per session: Not on file  . Stress: Not on file  Relationships  . Social connections:    Talks on phone: Not on file    Gets together: Not on file    Attends religious service: Not on file    Active member of club or organization:  Not on file    Attends meetings of clubs or organizations: Not on file    Relationship status: Not on file  . Intimate partner violence:    Fear of current or ex partner: Not on file    Emotionally abused: Not on file    Physically abused: Not on file    Forced sexual activity: Not on file  Other Topics Concern  . Not on file  Social History Narrative  . Not on file      Review of Systems  Constitutional: Negative for activity change, chills, fatigue and unexpected weight change.  HENT: Negative for congestion, postnasal drip, rhinorrhea, sneezing and sore throat.   Eyes: Negative for redness.  Respiratory: Negative for cough, chest tightness and shortness of breath.   Cardiovascular: Negative for  chest pain and palpitations.  Gastrointestinal: Negative for abdominal pain, constipation, diarrhea, nausea and vomiting.       GERD - well managed   Endocrine: Negative for cold intolerance, heat intolerance, polydipsia and polyuria.  Genitourinary: Negative for dysuria and frequency.  Musculoskeletal: Negative for arthralgias, back pain, joint swelling and neck pain.       Multiple joints, especially elbows.  Reviewed connective tissue panel which was normal.   Skin: Negative for rash.  Allergic/Immunologic: Negative for environmental allergies.  Neurological: Negative for dizziness, tremors, numbness and headaches.  Hematological: Negative for adenopathy. Does not bruise/bleed easily.  Psychiatric/Behavioral: Negative for behavioral problems (Depression), sleep disturbance and suicidal ideas. The patient is not nervous/anxious.        Very mild, intermittent anxiety. Takes alprazolam if needed at night.    Today's Vitals   12/04/18 1046  BP: (!) 145/86  Pulse: 75  Resp: 16  SpO2: 99%  Weight: 125 lb 6.4 oz (56.9 kg)  Height: 5\' 7"  (1.702 m)   Body mass index is 19.64 kg/m.  Physical Exam Vitals signs and nursing note reviewed.  Constitutional:      Appearance: Normal appearance. She is well-developed.  HENT:     Head: Normocephalic and atraumatic.  Eyes:     Conjunctiva/sclera: Conjunctivae normal.     Pupils: Pupils are equal, round, and reactive to light.  Neck:     Musculoskeletal: Normal range of motion and neck supple.     Thyroid: No thyromegaly.     Vascular: No carotid bruit or JVD.     Comments: No carotid bruit, bilaterally.  Cardiovascular:     Rate and Rhythm: Normal rate and regular rhythm.     Heart sounds: Normal heart sounds.  Pulmonary:     Effort: Pulmonary effort is normal. No respiratory distress.     Breath sounds: Normal breath sounds. No wheezing.  Abdominal:     General: Bowel sounds are normal.     Palpations: Abdomen is soft.      Tenderness: There is no abdominal tenderness.  Musculoskeletal: Normal range of motion.     Comments: Mild, generalized joint tenderness present. ROM and strength intact. No joint deformities or abnormalities are noted at this time.   Skin:    General: Skin is warm and dry.  Neurological:     General: No focal deficit present.     Mental Status: She is alert and oriented to person, place, and time.  Psychiatric:        Mood and Affect: Mood is anxious.        Speech: Speech normal.        Behavior: Behavior normal.  Thought Content: Thought content normal.        Cognition and Memory: Cognition and memory normal.        Judgment: Judgment normal.   Assessment/Plan:  1. Gastroesophageal reflux disease without esophagitis Continue protonix as prescribed.   2. Insomnia, unspecified type May continue to take alprazolam 0.25mg  at bedtime as needed for anxiety/insomnia. New prescription sent to her pharmacy.  - ALPRAZolam (XANAX) 0.25 MG tablet; Take 1 tablet (0.25 mg total) by mouth at bedtime as needed for anxiety.  Dispense: 30 tablet; Refill: 3 General Counseling: Ardene verbalizes understanding of the findings of todays visit and agrees with plan of treatment. I have discussed any further diagnostic evaluation that may be needed or ordered today. We also reviewed her medications today. she has been encouraged to call the office with any questions or concerns that should arise related to todays visit.  This patient was seen by [place] with Dr Lavera Guise as a part of collaborative care agreement  Meds ordered this encounter  Medications  . ALPRAZolam (XANAX) 0.25 MG tablet    Sig: Take 1 tablet (0.25 mg total) by mouth at bedtime as needed for anxiety.    Dispense:  30 tablet    Refill:  3    Order Specific Question:   Supervising Provider    Answer:   Lavera Guise [[id number]]    Time spent: 53 Minutes      Dr Lavera Guise Internal medicine

## 2019-04-05 ENCOUNTER — Ambulatory Visit: Payer: BLUE CROSS/BLUE SHIELD | Admitting: Nurse Practitioner

## 2019-04-05 ENCOUNTER — Ambulatory Visit (INDEPENDENT_AMBULATORY_CARE_PROVIDER_SITE_OTHER): Payer: BLUE CROSS/BLUE SHIELD | Admitting: Nurse Practitioner

## 2019-04-05 ENCOUNTER — Encounter: Payer: Self-pay | Admitting: Nurse Practitioner

## 2019-04-05 ENCOUNTER — Other Ambulatory Visit: Payer: Self-pay

## 2019-04-05 VITALS — BP 147/91 | HR 77 | Temp 97.2°F | Resp 16 | Ht 67.0 in | Wt 124.0 lb

## 2019-04-05 DIAGNOSIS — R300 Dysuria: Secondary | ICD-10-CM | POA: Diagnosis not present

## 2019-04-05 DIAGNOSIS — Z0001 Encounter for general adult medical examination with abnormal findings: Secondary | ICD-10-CM

## 2019-04-05 DIAGNOSIS — Z1239 Encounter for other screening for malignant neoplasm of breast: Secondary | ICD-10-CM

## 2019-04-05 DIAGNOSIS — K219 Gastro-esophageal reflux disease without esophagitis: Secondary | ICD-10-CM | POA: Diagnosis not present

## 2019-04-05 NOTE — Progress Notes (Signed)
[facility] [place], [place] [address]  Internal MEDICINE  Office Visit Note  Patient Name: Sarah Clarke  [id number]  [id number]  Date of Service: 04/16/2019   Pt is here for routine health maintenance examination  Chief Complaint  Patient presents with  . Annual Exam  . Gastroesophageal Reflux  . Anxiety  . Dyspareunia    past couple of months     The patient is here for health maintenance exam. She states that in the past few months, she has been having pain with intercourse. She does use topical lubrication, this helped for a long time, but last few months has not. She has been on hormone replacements in the past, after she had hysterectomy. She felt awful with them and does not want tot take any medication to help this.     Current Medication: Outpatient Encounter Medications as of 04/05/2019  Medication Sig  . ALPRAZolam (XANAX) 0.25 MG tablet Take 1 tablet (0.25 mg total) by mouth at bedtime as needed for anxiety.  Marland Kitchen ibuprofen (ADVIL,MOTRIN) 600 MG tablet Take 600 mg by mouth as needed.   . pantoprazole (PROTONIX) 40 MG tablet Take 1 tablet (40 mg total) by mouth daily.  . [DISCONTINUED] fluconazole (DIFLUCAN) 150 MG tablet Take one tablet by mouth, take another tablet in three days if symptoms persist (Patient not taking: Reported on 04/05/2019)   No facility-administered encounter medications on file as of 04/05/2019.     Surgical History: Past Surgical History:  Procedure Laterality Date  . ABDOMINAL HYSTERECTOMY     Partial  . BACK SURGERY    . COLON SURGERY    . COLONOSCOPY WITH PROPOFOL N/A 07/14/2017   Procedure: COLONOSCOPY WITH PROPOFOL;  Surgeon: Jonathon Bellows, MD;  Location: [facility] ENDOSCOPY;  Service: Gastroenterology;  Laterality: N/A;  . LAPAROTOMY N/A 12/29/2014   Procedure: EXPLORATORY LAPAROTOMY with right hemicolectomy ;  Surgeon: Marlyce Huge, MD;  Location: [facility] ORS;  Service: General;  Laterality: N/A;  . neck  fusion      Medical History: Past Medical History:  Diagnosis Date  . Anxiety   . Arthritis     Family History: Family History  Problem Relation Age of Onset  . Diabetes Mother   . Hypertension Father       Review of Systems  Constitutional: Negative for activity change, chills, fatigue and unexpected weight change.  HENT: Negative for congestion, postnasal drip, rhinorrhea, sneezing and sore throat.   Respiratory: Negative for cough, chest tightness and shortness of breath.   Cardiovascular: Negative for chest pain and palpitations.  Gastrointestinal: Negative for abdominal pain, constipation, diarrhea, nausea and vomiting.       GERD - well managed   Endocrine: Negative for cold intolerance, heat intolerance, polydipsia and polyuria.  Genitourinary: Positive for dyspareunia. Negative for dysuria and frequency.  Musculoskeletal: Negative for arthralgias, back pain, joint swelling and neck pain.       Multiple joints, especially elbows.  Reviewed connective tissue panel which was normal.   Skin: Negative for rash.  Allergic/Immunologic: Negative for environmental allergies.  Neurological: Negative for dizziness, tremors, numbness and headaches.  Hematological: Negative for adenopathy. Does not bruise/bleed easily.  Psychiatric/Behavioral: Negative for behavioral problems (Depression), sleep disturbance and suicidal ideas. The patient is nervous/anxious.        Very mild, intermittent anxiety. Takes alprazolam if needed at night.      Today's Vitals   04/05/19 1048  BP: (!) 147/91  Pulse: 77  Resp: 16  Temp: (!) 97.2 F (36.2 C)  SpO2: 99%  Weight: 124 lb (56.2 kg)  Height: 5\' 7"  (1.702 m)   Body mass index is 19.42 kg/m.  Physical Exam Vitals signs and nursing note reviewed.  Constitutional:      Appearance: Normal appearance. She is well-developed.  HENT:     Head: Normocephalic and atraumatic.     Nose: Nose normal.     Mouth/Throat:     Mouth: Mucous  membranes are moist.     Pharynx: Oropharynx is clear.  Eyes:     Conjunctiva/sclera: Conjunctivae normal.     Pupils: Pupils are equal, round, and reactive to light.  Neck:     Musculoskeletal: Normal range of motion and neck supple.     Thyroid: No thyromegaly.     Vascular: No carotid bruit or JVD.  Cardiovascular:     Rate and Rhythm: Normal rate and regular rhythm.     Pulses: Normal pulses.     Heart sounds: Normal heart sounds.  Pulmonary:     Effort: Pulmonary effort is normal. No respiratory distress.     Breath sounds: Normal breath sounds. No wheezing.  Chest:     Breasts:        Right: Normal. No swelling, bleeding, inverted nipple, mass, nipple discharge, skin change or tenderness.        Left: Normal. No swelling, bleeding, inverted nipple, mass, nipple discharge, skin change or tenderness.  Abdominal:     General: Bowel sounds are normal.     Palpations: Abdomen is soft.     Tenderness: There is no abdominal tenderness.  Musculoskeletal: Normal range of motion.     Comments: Mild, generalized joint tenderness present. ROM and strength intact. No joint deformities or abnormalities are noted at this time.   Skin:    General: Skin is warm and dry.  Neurological:     General: No focal deficit present.     Mental Status: She is alert and oriented to person, place, and time.  Psychiatric:        Mood and Affect: Mood is anxious.        Speech: Speech normal.        Behavior: Behavior normal.        Thought Content: Thought content normal.        Cognition and Memory: Cognition and memory normal.        Judgment: Judgment normal.     Assessment/Plan: 1. Encounter for general adult medical examination with abnormal findings Annual health maintenance exam today.  2. Gastroesophageal reflux disease without esophagitis Continue pantoprazole as prescribed    3. Screening for breast cancer - MM DIGITAL SCREENING BILATERAL; Future  4. Dysuria - UA/M w/rflx  Culture, Routine  General Counseling: Sarah Clarke verbalizes understanding of the findings of todays visit and agrees with plan of treatment. I have discussed any further diagnostic evaluation that may be needed or ordered today. We also reviewed her medications today. she has been encouraged to call the office with any questions or concerns that should arise related to todays visit.    Counseling:  This patient was seen by Leretha Pol FNP Collaboration with Dr Lavera Guise as a part of collaborative care agreement  Orders Placed This Encounter  Procedures  . Microscopic Examination  . MM DIGITAL SCREENING BILATERAL  . UA/M w/rflx Culture, Routine     Time spent: [place], MD  Internal Medicine

## 2019-04-06 LAB — UA/M W/RFLX CULTURE, ROUTINE
Bilirubin, UA: NEGATIVE
Glucose, UA: NEGATIVE
Ketones, UA: NEGATIVE
Leukocytes,UA: NEGATIVE
Nitrite, UA: NEGATIVE
Protein,UA: NEGATIVE
RBC, UA: NEGATIVE
Specific Gravity, UA: 1.017 (ref 1.005–1.030)
Urobilinogen, Ur: 0.2 mg/dL (ref 0.2–1.0)
pH, UA: 6 (ref 5.0–7.5)

## 2019-04-06 LAB — MICROSCOPIC EXAMINATION

## 2019-04-11 ENCOUNTER — Other Ambulatory Visit: Payer: Self-pay | Admitting: Nurse Practitioner

## 2019-04-12 LAB — COMPREHENSIVE METABOLIC PANEL
ALT: 18 IU/L (ref 0–32)
AST: 31 IU/L (ref 0–40)
Albumin/Globulin Ratio: 1.9 (ref 1.2–2.2)
Albumin: 4.6 g/dL (ref 3.8–4.9)
Alkaline Phosphatase: 68 IU/L (ref 39–117)
BUN/Creatinine Ratio: 9 (ref 9–23)
BUN: 7 mg/dL (ref 6–24)
Bilirubin Total: 0.3 mg/dL (ref 0.0–1.2)
CO2: 24 mmol/L (ref 20–29)
Calcium: 9.6 mg/dL (ref 8.7–10.2)
Chloride: 98 mmol/L (ref 96–106)
Creatinine, Ser: 0.76 mg/dL (ref 0.57–1.00)
GFR calc Af Amer: 99 mL/min/{1.73_m2} (ref >59)
GFR calc non Af Amer: 86 mL/min/{1.73_m2} (ref >59)
Globulin, Total: 2.4 g/dL (ref 1.5–4.5)
Glucose: 84 mg/dL (ref 65–99)
Potassium: 4.7 mmol/L (ref 3.5–5.2)
Sodium: 135 mmol/L (ref 134–144)
Total Protein: 7 g/dL (ref 6.0–8.5)

## 2019-04-12 LAB — LIPID PANEL W/O CHOL/HDL RATIO
Cholesterol, Total: 249 mg/dL — ABNORMAL HIGH (ref 100–199)
HDL: 139 mg/dL (ref >39)
LDL Chol Calc (NIH): 93 mg/dL (ref 0–99)
Triglycerides: 106 mg/dL (ref 0–149)
VLDL Cholesterol Cal: 17 mg/dL (ref 5–40)

## 2019-04-12 LAB — TSH: TSH: 0.996 u[IU]/mL (ref 0.450–4.500)

## 2019-04-12 LAB — CBC
Hematocrit: 36.2 % (ref 34.0–46.6)
Hemoglobin: 12.3 g/dL (ref 11.1–15.9)
MCH: 29 pg (ref 26.6–33.0)
MCHC: 34 g/dL (ref 31.5–35.7)
MCV: 85 fL (ref 79–97)
Platelets: 368 10*3/uL (ref 150–450)
RBC: 4.24 x10E6/uL (ref 3.77–5.28)
RDW: 13.4 % (ref 11.7–15.4)
WBC: 3.5 10*3/uL (ref 3.4–10.8)

## 2019-04-12 LAB — VITAMIN D 25 HYDROXY (VIT D DEFICIENCY, FRACTURES): Vit D, 25-Hydroxy: 35 ng/mL (ref 30.0–100.0)

## 2019-04-12 LAB — T4, FREE: Free T4: 0.98 ng/dL (ref 0.82–1.77)

## 2019-04-15 DIAGNOSIS — Z0001 Encounter for general adult medical examination with abnormal findings: Secondary | ICD-10-CM | POA: Insufficient documentation

## 2019-04-15 DIAGNOSIS — Z79899 Other long term (current) drug therapy: Secondary | ICD-10-CM | POA: Insufficient documentation

## 2019-04-15 DIAGNOSIS — R300 Dysuria: Secondary | ICD-10-CM | POA: Insufficient documentation

## 2019-05-06 IMAGING — CT CT CERVICAL SPINE W/O CM
3 of 7 series · 10 of 34 positions shown, 11 images · non-contrast
Comparison: 06/19/2014, 05/29/2014

CLINICAL DATA: Fell, landed on face bruising to the nose and orbits

EXAM:
CT MAXILLOFACIAL WITHOUT CONTRAST
CT CERVICAL SPINE WITHOUT CONTRAST
TECHNIQUE: Multidetector CT imaging of the maxillofacial structures was
performed. Multiplanar CT image reconstructions were also generated.
A small metallic BB was placed on the right temple in order to
reliably differentiate right from left.
Multidetector CT imaging of the cervical spine was performed without
intravenous contrast. Multiplanar CT image reconstructions were also
generated.

[Series 11: sagittal bone · sagittal · 0.21mm/px · 5 of 37 slices shown]
[im 10/37  bone]
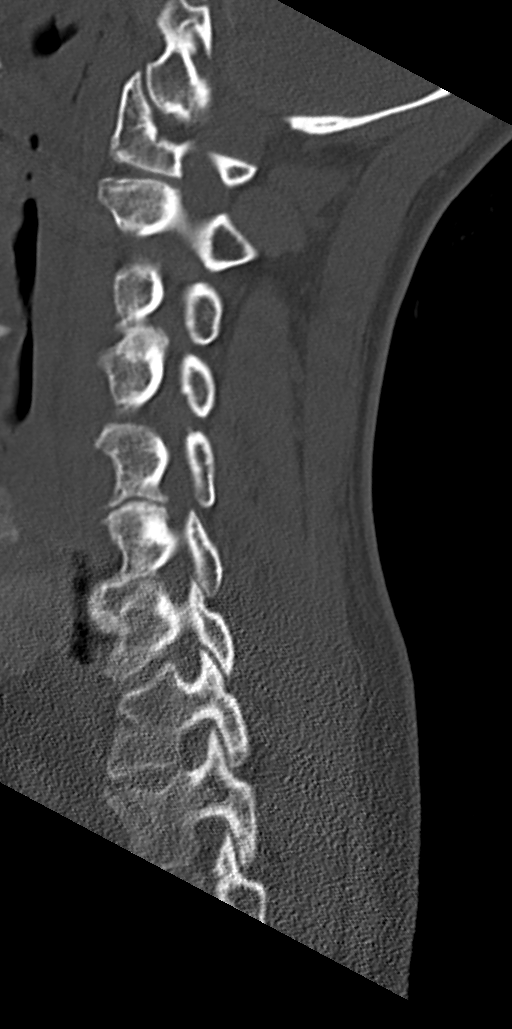
[im 14/37  bone]
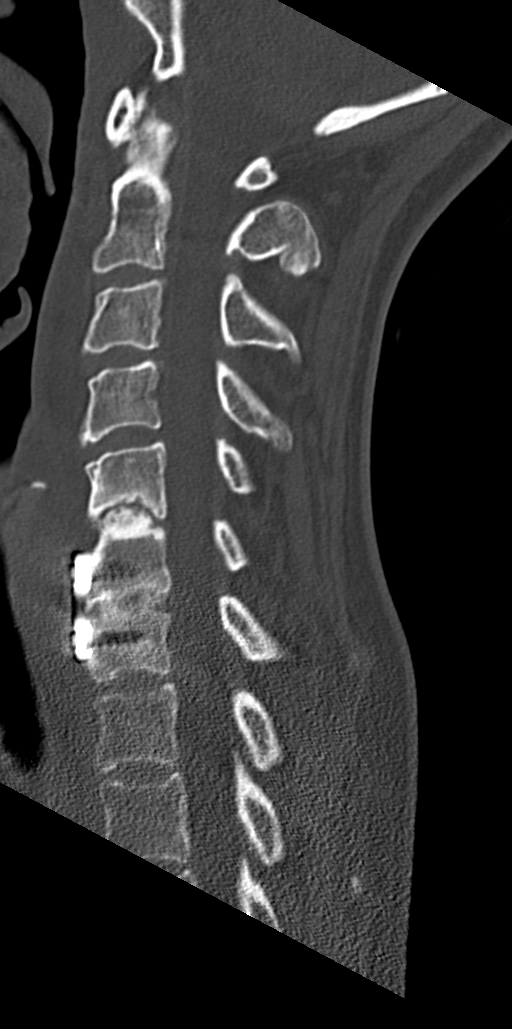
[im 17/37  bone]
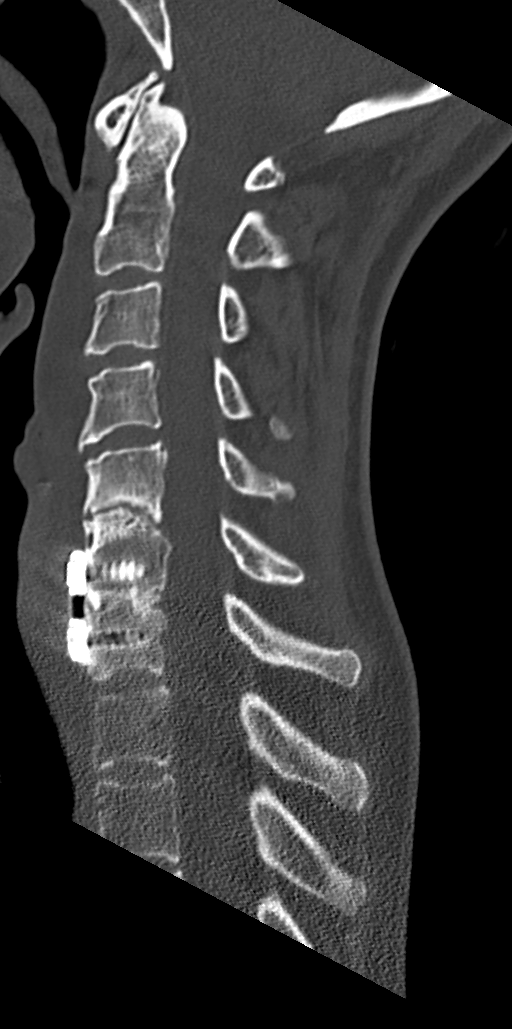
[im 20/37  bone]
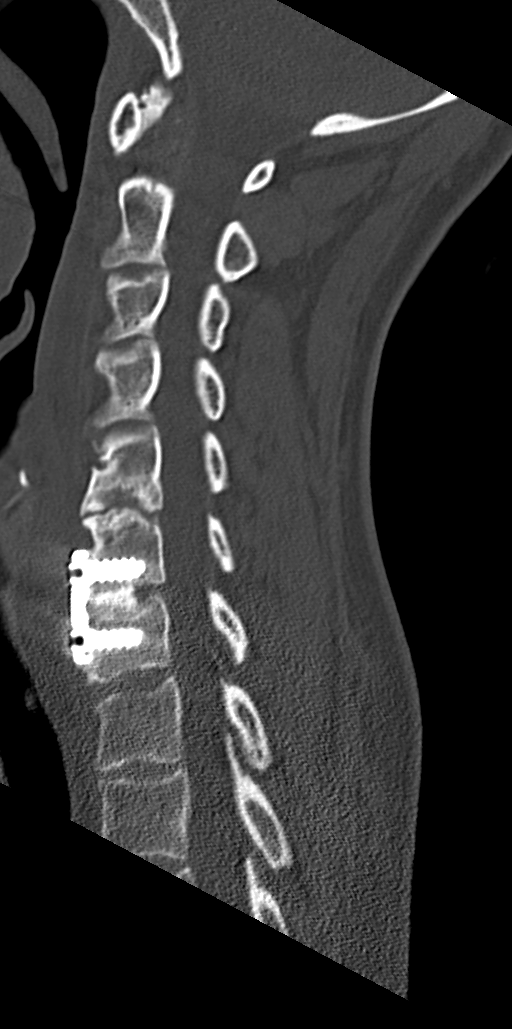
[im 23/37  bone]
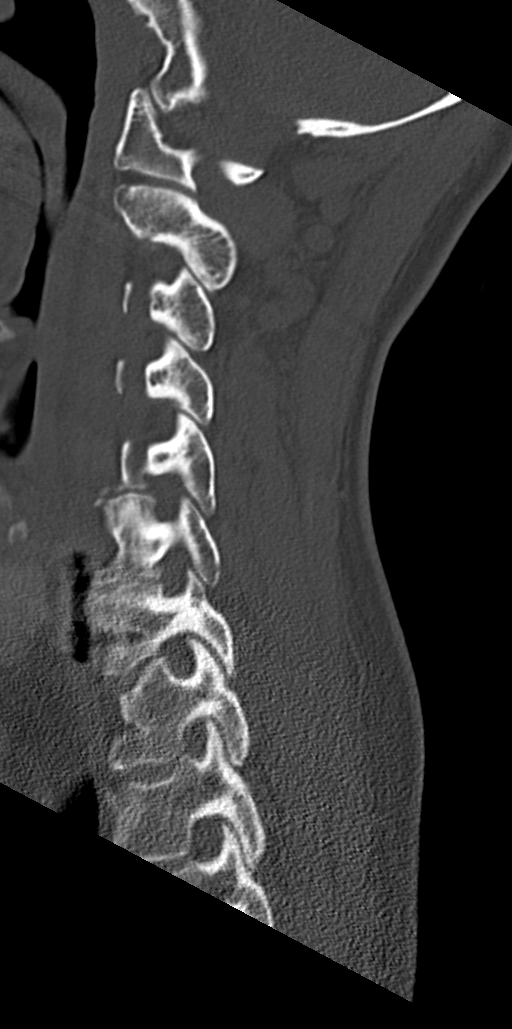

[Series 12: coronal bone · coronal · 0.23mm/px · 2 of 43 slices shown]
[im 20/43  bone]
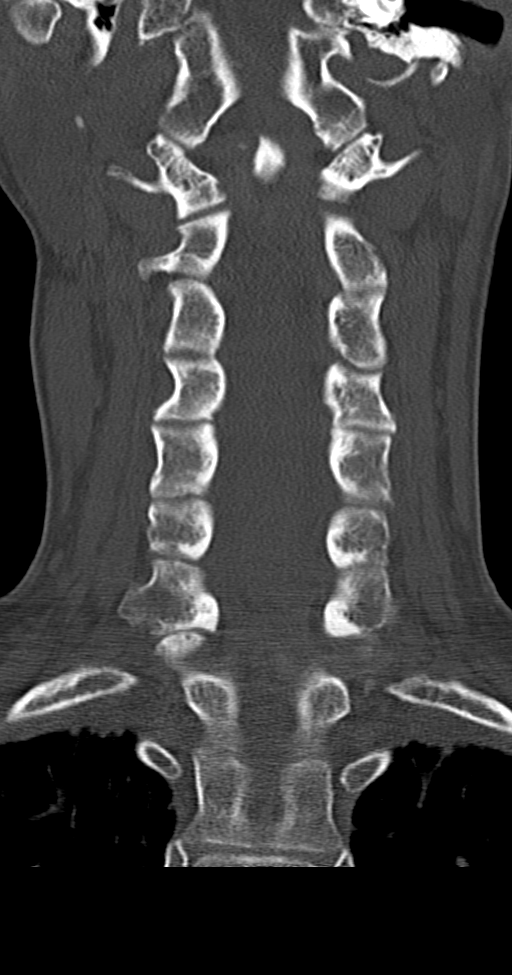
[im 31/43  bone]
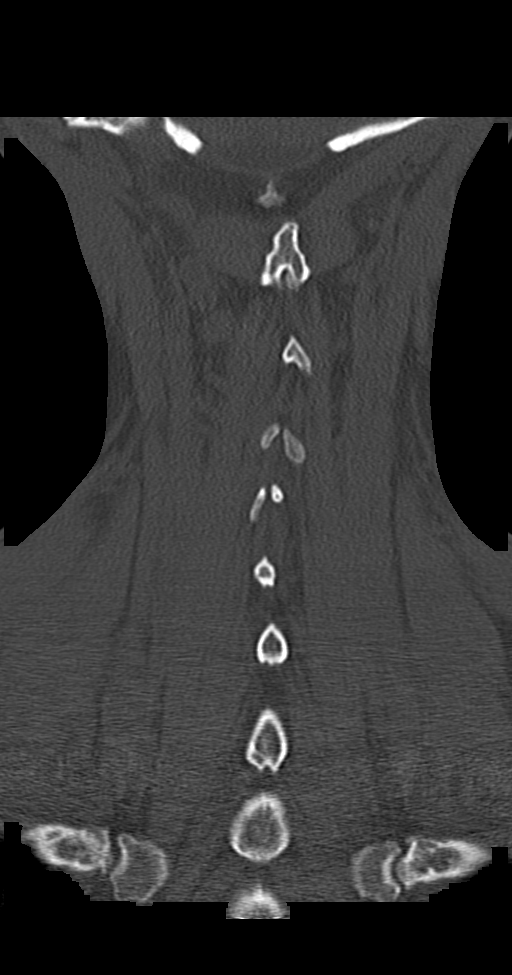

[Series 14: orthogonal bone · axial · 0.21mm/px · z∈[+155,+239]mm · 3 of 98 slices shown, 4 images]
[im 25/98  soft-tissue]
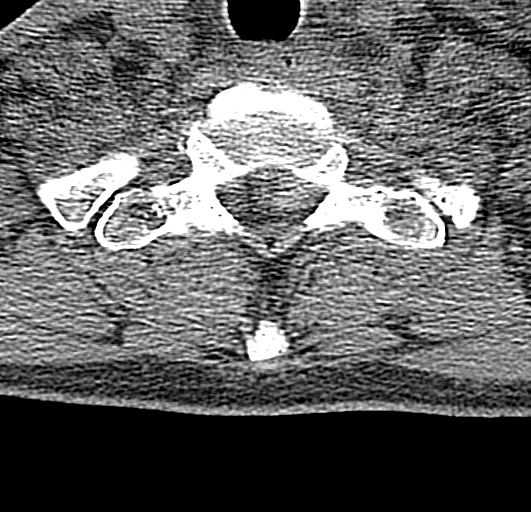
[im 25/98  bone]
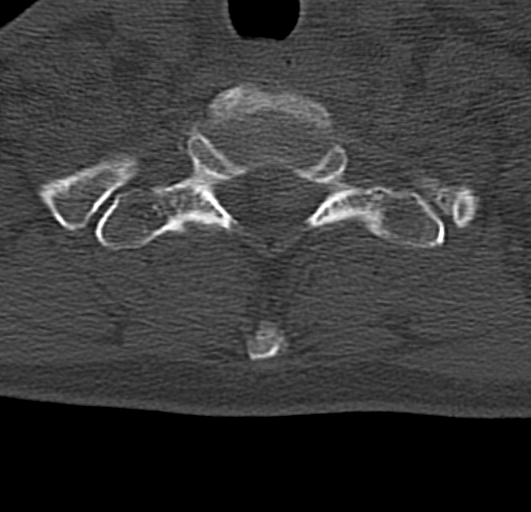
[im 49/98  bone]
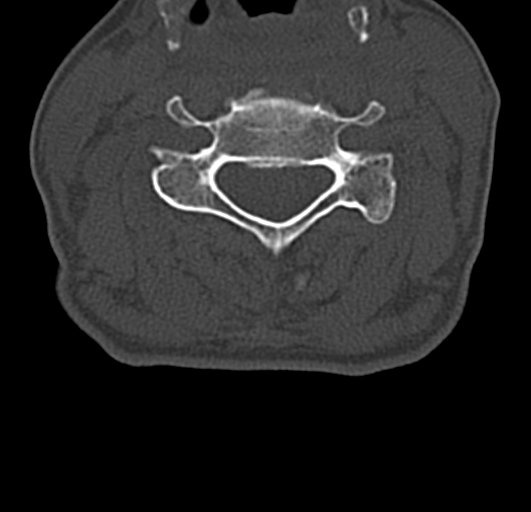
[im 73/98  bone]
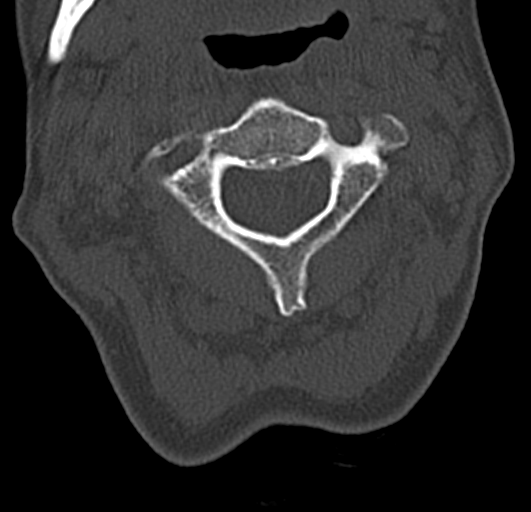

[10 of 34 positions shown; findings below may reference images not displayed]

FINDINGS: CT MAXILLOFACIAL FINDINGS

Osseous: Bilateral mandibular heads are normally position. No
mandibular fracture. Possible nondisplaced fracture at the anterior
nasal process of the maxilla. Minimal left greater than right nasal
bone deformity suspect for fracture. Mastoid air cells are clear.

Orbits: Negative. No traumatic or inflammatory finding.

Sinuses: Mild mucosal thickening in the maxillary and ethmoid
sinuses. No acute fluid level. Leftward deviation of the anterior
nasal septum.

Soft tissues: Soft tissue swelling over the forehead, orbits, and
nasal area.

Limited intracranial: No significant or unexpected finding.

CT CERVICAL FINDINGS

Alignment: Straightening of the cervical spine. No subluxation.
Facet alignment within normal limits.

Skull base and vertebrae: No fracture is seen. Craniovertebral
junction is intact.

Soft tissues and spinal canal: Enlargement of the prevertebral soft
tissues C2 through C4, there is retropharyngeal fluid collection
extending from C2 through C5-C6. This measures 3 cm transverse by 7
mm AP.

Disc levels: Status post anterior plate and screw fixation at C6 and
C7. Prominent lucency within the C6 vertebral body. Marked
degenerative changes at C5-C6.

Upper chest: Lung apices are clear.  No significant thyroid mass.

Other: None
IMPRESSION: 1. Mild deformity of the nasal bones, consistent with mild fracture.
No evidence for orbital fracture.
2. Straightening of the cervical spine without fracture seen
3. Abnormal enlarged prevertebral soft tissues C2 through C4 with
evidence of retropharyngeal fluid collection. Appearance not typical
for trauma, query any signs or symptoms for infection. MRI is
suggested for further evaluation.

## 2019-05-20 NOTE — Progress Notes (Signed)
Mild elevation of LDL and total cholesterol. Labs otherwise normal. Discuss in depth at her next visit.

## 2019-07-31 ENCOUNTER — Encounter: Payer: Self-pay | Admitting: Nurse Practitioner

## 2019-07-31 ENCOUNTER — Other Ambulatory Visit: Payer: Self-pay

## 2019-07-31 ENCOUNTER — Ambulatory Visit: Payer: BLUE CROSS/BLUE SHIELD | Admitting: Nurse Practitioner

## 2019-07-31 VITALS — Temp 97.1°F | Ht 67.0 in | Wt 123.0 lb

## 2019-07-31 DIAGNOSIS — B379 Candidiasis, unspecified: Secondary | ICD-10-CM

## 2019-07-31 DIAGNOSIS — J069 Acute upper respiratory infection, unspecified: Secondary | ICD-10-CM

## 2019-07-31 MED ORDER — FLUCONAZOLE 150 MG PO TABS: ORAL_TABLET | ORAL | 0 refills | Status: DC

## 2019-07-31 MED ORDER — AMOXICILLIN 875 MG PO TABS: ORAL_TABLET | ORAL | 0 refills | Status: DC

## 2019-07-31 NOTE — Progress Notes (Signed)
[facility] [place], [place] [address]  Internal MEDICINE  Telephone Visit  Patient Name: Sarah Clarke  [id number]  [id number]  Date of Service: 08/04/2019  I connected with the patient at 4:54pm by webcam and verified the patients identity using two identifiers.   I discussed the limitations, risks, security and privacy concerns of performing an evaluation and management service by webcam and the availability of in person appointments. I also discussed with the patient that there may be a patient responsible charge related to the service.  The patient expressed understanding and agrees to proceed.    Chief Complaint  Patient presents with  . Telephone Assessment  . Telephone Screen  . Cough    cough up phlegm that is yellow green   . Ear Pain    left ear, sometimes feels like spasm is going through it, started sunday morning   . Spasms    started on both sides behind breast and ribs area  . Fever    had a fever earlier today   . Sinusitis    sinus pressure     The patient has been contacted via webcam for follow up visit due to concerns for spread of novel coronavirus. The patient presents for acute visit. The patient states that since Sunday, she has cough, congestion in her throat nasal congestion. She has sinus pressure on the left side and ears feel full and congested. Cough is productive. Has low grade fever. States that she went to work yesterday. States that around noon or one oclock, she started feeling really bad. She had to come home she felt so bad. She had fever of 100 when she got home. Was unable to go back to work today. She does not believe she has been exposed to COVID 19. Her boyfriend had similar symptoms at the beginning of the month. He was tested for COVID 19 and was negative. She denies nausea, vomiting, loss of appetite.       Current Medication: Outpatient Encounter Medications as of 07/31/2019  Medication Sig  . ALPRAZolam  (XANAX) 0.25 MG tablet Take 1 tablet (0.25 mg total) by mouth at bedtime as needed for anxiety.  Marland Kitchen ibuprofen (ADVIL,MOTRIN) 600 MG tablet Take 600 mg by mouth as needed.   . pantoprazole (PROTONIX) 40 MG tablet Take 1 tablet (40 mg total) by mouth daily.  Marland Kitchen amoxicillin (AMOXIL) 875 MG tablet Take one tabet po BID for 10 days for acute upper respiratory infection.  . fluconazole (DIFLUCAN) 150 MG tablet Take 1 tablet po once. May repeat dose in 3 days as needed for persistent symptoms.   No facility-administered encounter medications on file as of 07/31/2019.    Surgical History: Past Surgical History:  Procedure Laterality Date  . ABDOMINAL HYSTERECTOMY     Partial  . BACK SURGERY    . COLON SURGERY    . COLONOSCOPY WITH PROPOFOL N/A 07/14/2017   Procedure: COLONOSCOPY WITH PROPOFOL;  Surgeon: Jonathon Bellows, MD;  Location: [facility] ENDOSCOPY;  Service: Gastroenterology;  Laterality: N/A;  . LAPAROTOMY N/A 12/29/2014   Procedure: EXPLORATORY LAPAROTOMY with right hemicolectomy ;  Surgeon: Marlyce Huge, MD;  Location: [facility] ORS;  Service: General;  Laterality: N/A;  . neck fusion      Medical History: Past Medical History:  Diagnosis Date  . Anxiety   . Arthritis     Family History: Family History  Problem Relation Age of Onset  . Diabetes Mother   . Hypertension Father  Social History   Socioeconomic History  . Marital status: Divorced    Spouse name: Not on file  . Number of children: Not on file  . Years of education: Not on file  . Highest education level: Not on file  Occupational History  . Not on file  Tobacco Use  . Smoking status: Never Smoker  . Smokeless tobacco: Never Used  Substance and Sexual Activity  . Alcohol use: Yes    Comment: social  . Drug use: No  . Sexual activity: Not Currently  Other Topics Concern  . Not on file  Social History Narrative  . Not on file   Social Determinants of Health   Financial Resource Strain:   . Difficulty  of Paying Living Expenses: Not on file  Food Insecurity:   . Worried About Charity fundraiser in the Last Year: Not on file  . Ran Out of Food in the Last Year: Not on file  Transportation Needs:   . Lack of Transportation (Medical): Not on file  . Lack of Transportation (Non-Medical): Not on file  Physical Activity:   . Days of Exercise per Week: Not on file  . Minutes of Exercise per Session: Not on file  Stress:   . Feeling of Stress : Not on file  Social Connections:   . Frequency of Communication with Friends and Family: Not on file  . Frequency of Social Gatherings with Friends and Family: Not on file  . Attends Religious Services: Not on file  . Active Member of Clubs or Organizations: Not on file  . Attends Archivist Meetings: Not on file  . Marital Status: Not on file  Intimate Partner Violence:   . Fear of Current or Ex-Partner: Not on file  . Emotionally Abused: Not on file  . Physically Abused: Not on file  . Sexually Abused: Not on file      Review of Systems  Constitutional: Positive for appetite change, chills, fatigue and fever. Negative for unexpected weight change.  HENT: Positive for congestion, ear pain, postnasal drip, rhinorrhea, sinus pressure, sinus pain and sore throat. Negative for sneezing.   Respiratory: Positive for cough. Negative for chest tightness, shortness of breath and wheezing.   Cardiovascular: Negative for chest pain and palpitations.  Gastrointestinal: Positive for nausea. Negative for abdominal pain, constipation, diarrhea and vomiting.  Musculoskeletal: Negative for arthralgias, back pain, joint swelling and neck pain.  Skin: Negative for rash.  Allergic/Immunologic: Negative for environmental allergies.  Neurological: Positive for headaches. Negative for tremors and numbness.  Hematological: Positive for adenopathy. Does not bruise/bleed easily.  Psychiatric/Behavioral: Negative for behavioral problems (Depression), sleep  disturbance and suicidal ideas. The patient is not nervous/anxious.     Today's Vitals   07/31/19 1623  Temp: (!) 97.1 F (36.2 C)  Weight: 123 lb (55.8 kg)  Height: 5\' 7"  (1.702 m)   Body mass index is 19.26 kg/m.  Observation/Objective:   The patient is alert and oriented. She is pleasant and answers all questions appropriately. Breathing is non-labored. She is in no acute distress at this time. The patient has significant nasal congestion and sounds as though she does not feel well.    Assessment/Plan: 1. Acute upper respiratory infection Start amoxicillin 875mg  twice daily for next 10 days. Rest and increase fluids. Take OTC medications as needed to improve acute symptoms. Advised patient to go for COVID 19 testing due to acute onset of symptoms and fever.  - amoxicillin (AMOXIL) 875 MG tablet;  Take one tabet po BID for 10 days for acute upper respiratory infection.  Dispense: 20 tablet; Refill: 0  2. Yeast infection Diflucan may be taken as needed and as prescribed if yeast infection develops.  - fluconazole (DIFLUCAN) 150 MG tablet; Take 1 tablet po once. May repeat dose in 3 days as needed for persistent symptoms.  Dispense: 3 tablet; Refill: 0  General Counseling: Darianny verbalizes understanding of the findings of today's phone visit and agrees with plan of treatment. I have discussed any further diagnostic evaluation that may be needed or ordered today. We also reviewed her medications today. she has been encouraged to call the office with any questions or concerns that should arise related to todays visit.     Person Under Monitoring Name: Sarah Clarke  Location: [place] [address]   Infection Prevention Recommendations for Individuals Confirmed to have, or Being Evaluated for, 2019 Novel Coronavirus (COVID-19) Infection Who Receive Care at Home  Individuals who are confirmed to have, or are being evaluated for, COVID-19 should follow the  prevention steps below until a healthcare provider or local or state health department says they can return to normal activities.  Stay home except to get medical care You should restrict activities outside your home, except for getting medical care. Do not go to work, school, or public areas, and do not use public transportation or taxis.  Call ahead before visiting your doctor Before your medical appointment, call the healthcare provider and tell them that you have, or are being evaluated for, COVID-19 infection. This will help the healthcare provider's office take steps to keep other people from getting infected. Ask your healthcare provider to call the local or state health department.  Monitor your symptoms Seek prompt medical attention if your illness is worsening (e.g., difficulty breathing). Before going to your medical appointment, call the healthcare provider and tell them that you have, or are being evaluated for, COVID-19 infection. Ask your healthcare provider to call the local or state health department.  Wear a facemask You should wear a facemask that covers your nose and mouth when you are in the same room with other people and when you visit a healthcare provider. People who live with or visit you should also wear a facemask while they are in the same room with you.  Separate yourself from other people in your home As much as possible, you should stay in a different room from other people in your home. Also, you should use a separate bathroom, if available.  Avoid sharing household items You should not share dishes, drinking glasses, cups, eating utensils, towels, bedding, or other items with other people in your home. After using these items, you should wash them thoroughly with soap and water.  Cover your coughs and sneezes Cover your mouth and nose with a tissue when you cough or sneeze, or you can cough or sneeze into your sleeve. Throw used tissues in a lined  trash can, and immediately wash your hands with soap and water for at least 20 seconds or use an alcohol-based hand rub.  Wash your [facility] your hands often and thoroughly with soap and water for at least 20 seconds. You can use an alcohol-based hand sanitizer if soap and water are not available and if your hands are not visibly dirty. Avoid touching your eyes, nose, and mouth with unwashed hands.   Prevention Steps for Caregivers and Household Members of Individuals Confirmed to have, or Being Evaluated  for, COVID-19 Infection Being Cared for in the Home  If you live with, or provide care at home for, a person confirmed to have, or being evaluated for, COVID-19 infection please follow these guidelines to prevent infection:  Follow healthcare provider's instructions Make sure that you understand and can help the patient follow any healthcare provider instructions for all care.  Provide for the patient's basic needs You should help the patient with basic needs in the home and provide support for getting groceries, prescriptions, and other personal needs.  Monitor the patient's symptoms If they are getting sicker, call his or her medical provider and tell them that the patient has, or is being evaluated for, COVID-19 infection. This will help the healthcare provider's office take steps to keep other people from getting infected. Ask the healthcare provider to call the local or state health department.  Limit the number of people who have contact with the patient  If possible, have only one caregiver for the patient.  Other household members should stay in another home or place of residence. If this is not possible, they should stay  in another room, or be separated from the patient as much as possible. Use a separate bathroom, if available.  Restrict visitors who do not have an essential need to be in the home.  Keep older adults, very young children, and other sick people away  from the patient Keep older adults, very young children, and those who have compromised immune systems or chronic health conditions away from the patient. This includes people with chronic heart, lung, or kidney conditions, diabetes, and cancer.  Ensure good ventilation Make sure that shared spaces in the home have good air flow, such as from an air conditioner or an opened window, weather permitting.  Wash your hands often  Wash your hands often and thoroughly with soap and water for at least 20 seconds. You can use an alcohol based hand sanitizer if soap and water are not available and if your hands are not visibly dirty.  Avoid touching your eyes, nose, and mouth with unwashed hands.  Use disposable paper towels to dry your hands. If not available, use dedicated cloth towels and replace them when they become wet.  Wear a facemask and gloves  Wear a disposable facemask at all times in the room and gloves when you touch or have contact with the patient's blood, body fluids, and/or secretions or excretions, such as sweat, saliva, sputum, nasal mucus, vomit, urine, or feces.  Ensure the mask fits over your nose and mouth tightly, and do not touch it during use.  Throw out disposable facemasks and gloves after using them. Do not reuse.  Wash your hands immediately after removing your facemask and gloves.  If your personal clothing becomes contaminated, carefully remove clothing and launder. Wash your hands after handling contaminated clothing.  Place all used disposable facemasks, gloves, and other waste in a lined container before disposing them with other household waste.  Remove gloves and wash your hands immediately after handling these items.  Do not share dishes, glasses, or other household items with the patient  Avoid sharing household items. You should not share dishes, drinking glasses, cups, eating utensils, towels, bedding, or other items with a patient who is confirmed to  have, or being evaluated for, COVID-19 infection.  After the person uses these items, you should wash them thoroughly with soap and water.  Wash laundry thoroughly  Immediately remove and wash clothes or bedding that have  blood, body fluids, and/or secretions or excretions, such as sweat, saliva, sputum, nasal mucus, vomit, urine, or feces, on them.  Wear gloves when handling laundry from the patient.  Read and follow directions on labels of laundry or clothing items and detergent. In general, wash and dry with the warmest temperatures recommended on the label.  Clean all areas the individual has used often  Clean all touchable surfaces, such as counters, tabletops, doorknobs, bathroom fixtures, toilets, phones, keyboards, tablets, and bedside tables, every day. Also, clean any surfaces that may have blood, body fluids, and/or secretions or excretions on them.  Wear gloves when cleaning surfaces the patient has come in contact with.  Use a diluted bleach solution (e.g., dilute bleach with 1 part bleach and 10 parts water) or a household disinfectant with a label that says EPA-registered for coronaviruses. To make a bleach solution at home, add 1 tablespoon of bleach to 1 quart (4 cups) of water. For a larger supply, add  cup of bleach to 1 gallon (16 cups) of water.  Read labels of cleaning products and follow recommendations provided on product labels. Labels contain instructions for safe and effective use of the cleaning product including precautions you should take when applying the product, such as wearing gloves or eye protection and making sure you have good ventilation during use of the product.  Remove gloves and wash hands immediately after cleaning.  Monitor yourself for signs and symptoms of illness Caregivers and household members are considered close contacts, should monitor their health, and will be asked to limit movement outside of the home to the extent possible. Follow  the monitoring steps for close contacts listed on the symptom monitoring form.   ? If you have additional questions, contact your local health department or call the epidemiologist on call at [phone] (available 24/7). ? This guidance is subject to change. For the most up-to-date guidance from [facility], please refer to their website: YouBlogs.pl  This patient was seen by Leretha Pol FNP Collaboration with Dr Lavera Guise as a part of collaborative care agreement  Meds ordered this encounter  Medications  . amoxicillin (AMOXIL) 875 MG tablet    Sig: Take one tabet po BID for 10 days for acute upper respiratory infection.    Dispense:  20 tablet    Refill:  0    Order Specific Question:   Supervising Provider    Answer:   Lavera Guise [id number]  . fluconazole (DIFLUCAN) 150 MG tablet    Sig: Take 1 tablet po once. May repeat dose in 3 days as needed for persistent symptoms.    Dispense:  3 tablet    Refill:  0    Order Specific Question:   Supervising Provider    Answer:   Lavera Guise [id number]    Time spent: 65 Minutes    Dr Lavera Guise Internal medicine

## 2019-08-03 ENCOUNTER — Telehealth: Payer: Self-pay

## 2019-08-03 NOTE — Telephone Encounter (Signed)
Spoke with pt that tested positive for covid her symptoms start on saturday days she only had headache and fatigue and also advised take tylenol and finish antibiotic drink plenty of water and  Rest if symptoms go to ED

## 2019-08-03 NOTE — Telephone Encounter (Signed)
Good. Thank you.

## 2019-08-04 ENCOUNTER — Other Ambulatory Visit: Payer: Self-pay | Admitting: Nurse Practitioner

## 2019-08-04 DIAGNOSIS — R059 Cough, unspecified: Secondary | ICD-10-CM

## 2019-08-04 DIAGNOSIS — J069 Acute upper respiratory infection, unspecified: Secondary | ICD-10-CM | POA: Insufficient documentation

## 2019-08-04 DIAGNOSIS — R05 Cough: Secondary | ICD-10-CM

## 2019-08-04 DIAGNOSIS — B379 Candidiasis, unspecified: Secondary | ICD-10-CM | POA: Insufficient documentation

## 2019-08-04 MED ORDER — HYDROCOD POLST-CPM POLST ER 10-8 MG/5ML PO SUER: 5.0000 mL | Freq: Two times a day (BID) | ORAL | 0 refills | Status: DC | PRN

## 2019-08-04 MED ORDER — METHYLPREDNISOLONE 4 MG PO TBPK: ORAL_TABLET | ORAL | 0 refills | Status: DC

## 2019-08-04 NOTE — Progress Notes (Signed)
Patient having persistent cough and congestion after testing positive for COVID 19 and treatment with amoxicillin for past 7 days. Sent prescriptions for medrol dose pack. Take as directed for 6 days. Also sent tussionex cough suppressant. This may be taken up to twice daily as needed for cough. Patient stated she is not allergic to hydrocodone and can take tussionex wihtout allergic response. Both medications were sent to [facility] [address].

## 2019-08-10 ENCOUNTER — Ambulatory Visit: Payer: BLUE CROSS/BLUE SHIELD | Admitting: Nurse Practitioner

## 2019-08-14 ENCOUNTER — Other Ambulatory Visit: Payer: Self-pay | Admitting: Nurse Practitioner

## 2019-08-14 ENCOUNTER — Telehealth: Payer: Self-pay

## 2019-08-14 DIAGNOSIS — J069 Acute upper respiratory infection, unspecified: Secondary | ICD-10-CM

## 2019-08-14 DIAGNOSIS — U071 COVID-19: Secondary | ICD-10-CM

## 2019-08-14 MED ORDER — AZITHROMYCIN 250 MG PO TABS: ORAL_TABLET | ORAL | 0 refills | Status: DC

## 2019-08-14 NOTE — Telephone Encounter (Signed)
PT WAS NOTIFIED OF THIS AND THAT Z PAK WAS SENT IN FOR HER.

## 2019-08-14 NOTE — Progress Notes (Signed)
Patient continues to have sore throat, congestion, and fatigue. Blowing lots of yellow mucus. Finished amoxicillin. Will do round of z-pack. Take as directed. Sent to her pharmacy.

## 2019-08-14 NOTE — Telephone Encounter (Signed)
Has been common for people to have symptoms for several weeks. According to CDC she should not be contagious any longer. Isolation is for 14 days after positive test. She should continues to rest and treat symptoms. Everyone has been different. I can't say how long these symptoms will last.

## 2019-08-17 ENCOUNTER — Encounter: Payer: Self-pay | Admitting: Nurse Practitioner

## 2019-08-17 ENCOUNTER — Ambulatory Visit (INDEPENDENT_AMBULATORY_CARE_PROVIDER_SITE_OTHER): Payer: BLUE CROSS/BLUE SHIELD | Admitting: Nurse Practitioner

## 2019-08-17 VITALS — Ht 67.0 in | Wt 123.0 lb

## 2019-08-17 DIAGNOSIS — J069 Acute upper respiratory infection, unspecified: Secondary | ICD-10-CM

## 2019-08-17 DIAGNOSIS — K219 Gastro-esophageal reflux disease without esophagitis: Secondary | ICD-10-CM

## 2019-08-17 DIAGNOSIS — U071 COVID-19: Secondary | ICD-10-CM

## 2019-08-17 DIAGNOSIS — G4700 Insomnia, unspecified: Secondary | ICD-10-CM

## 2019-08-17 MED ORDER — ALPRAZOLAM 0.25 MG PO TABS: 0.2500 mg | ORAL_TABLET | Freq: Every evening | ORAL | 3 refills | Status: DC | PRN

## 2019-08-17 MED ORDER — PANTOPRAZOLE SODIUM 40 MG PO TBEC: 40.0000 mg | DELAYED_RELEASE_TABLET | Freq: Every day | ORAL | 1 refills | Status: DC

## 2019-08-17 NOTE — Progress Notes (Signed)
[facility], [place] [address]  Internal MEDICINE  Telephone Visit  Patient Name: Sarah Clarke  [id number]  [id number]  Date of Service: 08/17/2019  I connected with the patient at 9:39am by telephone and verified the patients identity using two identifiers.   I discussed the limitations, risks, security and privacy concerns of performing an evaluation and management service by telephone and the availability of in person appointments. I also discussed with the patient that there may be a patient responsible charge related to the service.  The patient expressed understanding and agrees to proceed.    Chief Complaint  Patient presents with  . Telephone Assessment  . Telephone Screen  . Anxiety    The patient has been contacted via telephone for follow up visit due to concerns for spread of novel coronavirus. The patient presents for routine follow up visit. She has recently been diagnosed and treated for COVID 19 infection. She initially started with amoxiciilln. When no improvement, she was given a prednisone taper and z-pack. She states that, today, she is starting to feel like herself. She started back to work, a few hours a day. Will start increasing activity levels as tolerated.       Current Medication: Outpatient Encounter Medications as of 08/17/2019  Medication Sig  . ALPRAZolam (XANAX) 0.25 MG tablet Take 1 tablet (0.25 mg total) by mouth at bedtime as needed for anxiety.  Marland Kitchen azithromycin (ZITHROMAX) 250 MG tablet z-pack - take as directed for 5 days for URI d/t COVID 19  . chlorpheniramine-HYDROcodone (TUSSIONEX PENNKINETIC ER) 10-8 MG/5ML SUER Take 5 mLs by mouth every 12 (twelve) hours as needed for cough.  . fluconazole (DIFLUCAN) 150 MG tablet Take 1 tablet po once. May repeat dose in 3 days as needed for persistent symptoms.  Marland Kitchen ibuprofen (ADVIL,MOTRIN) 600 MG tablet Take 600 mg by mouth as needed.   . pantoprazole (PROTONIX) 40 MG  tablet Take 1 tablet (40 mg total) by mouth daily.  . [DISCONTINUED] methylPREDNISolone (MEDROL) 4 MG TBPK tablet Take by mouth as directed for 6 days   No facility-administered encounter medications on file as of 08/17/2019.    Surgical History: Past Surgical History:  Procedure Laterality Date  . ABDOMINAL HYSTERECTOMY     Partial  . BACK SURGERY    . COLON SURGERY    . COLONOSCOPY WITH PROPOFOL N/A 07/14/2017   Procedure: COLONOSCOPY WITH PROPOFOL;  Surgeon: Jonathon Bellows, MD;  Location: [facility] ENDOSCOPY;  Service: Gastroenterology;  Laterality: N/A;  . LAPAROTOMY N/A 12/29/2014   Procedure: EXPLORATORY LAPAROTOMY with right hemicolectomy ;  Surgeon: Marlyce Huge, MD;  Location: [facility] ORS;  Service: General;  Laterality: N/A;  . neck fusion      Medical History: Past Medical History:  Diagnosis Date  . Anxiety   . Arthritis     Family History: Family History  Problem Relation Age of Onset  . Diabetes Mother   . Hypertension Father     Social History   Socioeconomic History  . Marital status: Divorced    Spouse name: Not on file  . Number of children: Not on file  . Years of education: Not on file  . Highest education level: Not on file  Occupational History  . Not on file  Tobacco Use  . Smoking status: Never Smoker  . Smokeless tobacco: Never Used  Substance and Sexual Activity  . Alcohol use: Yes    Comment: social  . Drug use: No  .  Sexual activity: Not Currently  Other Topics Concern  . Not on file  Social History Narrative  . Not on file   Social Determinants of Health   Financial Resource Strain:   . Difficulty of Paying Living Expenses: Not on file  Food Insecurity:   . Worried About Charity fundraiser in the Last Year: Not on file  . Ran Out of Food in the Last Year: Not on file  Transportation Needs:   . Lack of Transportation (Medical): Not on file  . Lack of Transportation (Non-Medical): Not on file  Physical Activity:   . Days of  Exercise per Week: Not on file  . Minutes of Exercise per Session: Not on file  Stress:   . Feeling of Stress : Not on file  Social Connections:   . Frequency of Communication with Friends and Family: Not on file  . Frequency of Social Gatherings with Friends and Family: Not on file  . Attends Religious Services: Not on file  . Active Member of Clubs or Organizations: Not on file  . Attends Archivist Meetings: Not on file  . Marital Status: Not on file  Intimate Partner Violence:   . Fear of Current or Ex-Partner: Not on file  . Emotionally Abused: Not on file  . Physically Abused: Not on file  . Sexually Abused: Not on file      Review of Systems  Constitutional: Positive for fatigue. Negative for appetite change, chills, fever and unexpected weight change.  HENT: Negative for congestion, ear pain, postnasal drip, rhinorrhea, sinus pressure, sinus pain, sneezing and sore throat.   Respiratory: Negative for cough, chest tightness, shortness of breath and wheezing.   Cardiovascular: Negative for chest pain and palpitations.  Gastrointestinal: Positive for nausea. Negative for abdominal pain, constipation, diarrhea and vomiting.  Musculoskeletal: Negative for arthralgias, back pain, joint swelling and neck pain.  Skin: Negative for rash.  Allergic/Immunologic: Negative for environmental allergies.  Neurological: Positive for headaches. Negative for tremors and numbness.  Hematological: Positive for adenopathy. Does not bruise/bleed easily.  Psychiatric/Behavioral: Positive for sleep disturbance. Negative for behavioral problems (Depression) and suicidal ideas. The patient is nervous/anxious.     Today's Vitals   08/17/19 0909  Weight: 123 lb (55.8 kg)  Height: 5\' 7"  (1.702 m)   Body mass index is 19.26 kg/m.  Observation/Objective:   The patient is alert and oriented. She is pleasant and answers all questions appropriately. Breathing is non-labored. She is in no  acute distress at this time.    Assessment/Plan: 1. Upper respiratory tract infection due to COVID-19 virus Resolved. Patient has returned to work. Gradually feeling like herself.   2. Gastroesophageal reflux disease without esophagitis Continue pantoprazole as prescribed   3. Insomnia, unspecified type May take alprazolam 0.25mg  at bedtime as needed for insomnia/anxiety. New prescription sent to her pharmacy today.   General Counseling: Loretha verbalizes understanding of the findings of today's phone visit and agrees with plan of treatment. I have discussed any further diagnostic evaluation that may be needed or ordered today. We also reviewed her medications today. she has been encouraged to call the office with any questions or concerns that should arise related to todays visit.   This patient was seen by Leretha Pol FNP Collaboration with Dr Lavera Guise as a part of collaborative care agreement  Time spent: 22 Minutes    Dr Lavera Guise Internal medicine

## 2019-12-12 ENCOUNTER — Telehealth: Payer: Self-pay

## 2019-12-12 NOTE — Telephone Encounter (Signed)
Lmom to confirm and screen for 12-14-19 ov.

## 2019-12-12 NOTE — Telephone Encounter (Signed)
Confirmed appointment on 12/14/2019 and screened for covid. klh

## 2019-12-14 ENCOUNTER — Ambulatory Visit (INDEPENDENT_AMBULATORY_CARE_PROVIDER_SITE_OTHER): Payer: BLUE CROSS/BLUE SHIELD | Admitting: Nurse Practitioner

## 2019-12-14 ENCOUNTER — Other Ambulatory Visit: Payer: Self-pay

## 2019-12-14 ENCOUNTER — Encounter: Payer: Self-pay | Admitting: Nurse Practitioner

## 2019-12-14 VITALS — BP 126/65 | HR 84 | Temp 97.7°F | Resp 16 | Ht 67.0 in | Wt 123.0 lb

## 2019-12-14 DIAGNOSIS — N959 Unspecified menopausal and perimenopausal disorder: Secondary | ICD-10-CM

## 2019-12-14 DIAGNOSIS — K219 Gastro-esophageal reflux disease without esophagitis: Secondary | ICD-10-CM

## 2019-12-14 DIAGNOSIS — G4700 Insomnia, unspecified: Secondary | ICD-10-CM

## 2019-12-14 NOTE — Progress Notes (Signed)
[facility] [place], [place] [address]  Internal MEDICINE  Office Visit Note  Patient Name: Sarah Clarke  [id number]  [id number]  Date of Service: 12/19/2019  Chief Complaint  Patient presents with  . Anxiety  . Arthritis  . Gastroesophageal Reflux    The patient is here for routine follow up. She states that she is doing well, overall. She has a little depression recently. Has a little dog she is going to have to put to sleep due to chronic illness. She does take alprazolam 0.25mg  at bedtime to help her sleep. Tries not to take at all unless needed. She also takes protonix to reduce issues related to acid reflux. she has been having pain with intercourse. She does use topical lubrication, this helped for a long time, but last few months has not. She had partial hysterectomy several years ago. She states that she has not taken hormone replacements since she had this surgery.       Current Medication: Outpatient Encounter Medications as of 12/14/2019  Medication Sig  . ALPRAZolam (XANAX) 0.25 MG tablet Take 1 tablet (0.25 mg total) by mouth at bedtime as needed for anxiety.  . chlorpheniramine-HYDROcodone (TUSSIONEX PENNKINETIC ER) 10-8 MG/5ML SUER Take 5 mLs by mouth every 12 (twelve) hours as needed for cough.  . fluconazole (DIFLUCAN) 150 MG tablet Take 1 tablet po once. May repeat dose in 3 days as needed for persistent symptoms.  Marland Kitchen ibuprofen (ADVIL,MOTRIN) 600 MG tablet Take 600 mg by mouth as needed.   . pantoprazole (PROTONIX) 40 MG tablet Take 1 tablet (40 mg total) by mouth daily.  . [DISCONTINUED] azithromycin (ZITHROMAX) 250 MG tablet z-pack - take as directed for 5 days for URI d/t COVID 19 (Patient not taking: Reported on 12/14/2019)   No facility-administered encounter medications on file as of 12/14/2019.    Surgical History: Past Surgical History:  Procedure Laterality Date  . ABDOMINAL HYSTERECTOMY     Partial  . BACK SURGERY    .  COLON SURGERY    . COLONOSCOPY WITH PROPOFOL N/A 07/14/2017   Procedure: COLONOSCOPY WITH PROPOFOL;  Surgeon: Jonathon Bellows, MD;  Location: [facility] ENDOSCOPY;  Service: Gastroenterology;  Laterality: N/A;  . LAPAROTOMY N/A 12/29/2014   Procedure: EXPLORATORY LAPAROTOMY with right hemicolectomy ;  Surgeon: Marlyce Huge, MD;  Location: [facility] ORS;  Service: General;  Laterality: N/A;  . neck fusion      Medical History: Past Medical History:  Diagnosis Date  . Anxiety   . Arthritis     Family History: Family History  Problem Relation Age of Onset  . Diabetes Mother   . Hypertension Father     Social History   Socioeconomic History  . Marital status: Divorced    Spouse name: Not on file  . Number of children: Not on file  . Years of education: Not on file  . Highest education level: Not on file  Occupational History  . Not on file  Tobacco Use  . Smoking status: Never Smoker  . Smokeless tobacco: Never Used  Substance and Sexual Activity  . Alcohol use: Yes    Comment: social  . Drug use: No  . Sexual activity: Not Currently  Other Topics Concern  . Not on file  Social History Narrative  . Not on file   Social Determinants of Health   Financial Resource Strain:   . Difficulty of Paying Living Expenses:   Food Insecurity:   . Worried About Estate manager/land agent  of Food in the Last Year:   . [place] in the Last Year:   Transportation Needs:   . Lack of Transportation (Medical):   Marland Kitchen Lack of Transportation (Non-Medical):   Physical Activity:   . Days of Exercise per Week:   . Minutes of Exercise per Session:   Stress:   . Feeling of Stress :   Social Connections:   . Frequency of Communication with Friends and Family:   . Frequency of Social Gatherings with Friends and Family:   . Attends Religious Services:   . Active Member of Clubs or Organizations:   . Attends Archivist Meetings:   Marland Kitchen Marital Status:   Intimate Partner Violence:   . Fear of  Current or Ex-Partner:   . Emotionally Abused:   Marland Kitchen Physically Abused:   . Sexually Abused:       Review of Systems  Constitutional: Negative for activity change, chills, fatigue and unexpected weight change.  HENT: Negative for congestion, postnasal drip, rhinorrhea, sneezing and sore throat.   Respiratory: Negative for cough, chest tightness and shortness of breath.   Cardiovascular: Negative for chest pain and palpitations.  Gastrointestinal: Negative for abdominal pain, constipation, diarrhea, nausea and vomiting.       GERD - well managed   Endocrine: Negative for cold intolerance, heat intolerance, polydipsia and polyuria.  Genitourinary: Positive for dyspareunia. Negative for dysuria and frequency.  Musculoskeletal: Negative for arthralgias, back pain, joint swelling and neck pain.  Skin: Negative for rash.  Allergic/Immunologic: Negative for environmental allergies.  Neurological: Negative for dizziness, tremors, numbness and headaches.  Hematological: Negative for adenopathy. Does not bruise/bleed easily.  Psychiatric/Behavioral: Negative for behavioral problems (Depression), sleep disturbance and suicidal ideas. The patient is nervous/anxious.        Very mild, intermittent anxiety. Takes alprazolam if needed at night.     Today's Vitals   12/14/19 1011  BP: 126/65  Pulse: 84  Resp: 16  Temp: 97.7 F (36.5 C)  SpO2: 97%  Weight: 123 lb (55.8 kg)  Height: 5\' 7"  (1.702 m)   Body mass index is 19.26 kg/m.  Physical Exam Vitals and nursing note reviewed.  Constitutional:      Appearance: Normal appearance. She is well-developed.  HENT:     Head: Normocephalic and atraumatic.     Nose: Nose normal.  Eyes:     Conjunctiva/sclera: Conjunctivae normal.     Pupils: Pupils are equal, round, and reactive to light.  Neck:     Thyroid: No thyromegaly.     Vascular: No carotid bruit or JVD.     Comments: .  Cardiovascular:     Rate and Rhythm: Normal rate and regular  rhythm.     Heart sounds: Normal heart sounds.  Pulmonary:     Effort: Pulmonary effort is normal. No respiratory distress.     Breath sounds: Normal breath sounds. No wheezing.  Abdominal:     Palpations: Abdomen is soft.  Musculoskeletal:        General: Normal range of motion.     Cervical back: Normal range of motion and neck supple.  Skin:    General: Skin is warm and dry.  Neurological:     General: No focal deficit present.     Mental Status: She is alert and oriented to person, place, and time.  Psychiatric:        Mood and Affect: Mood is anxious.        Speech: Speech normal.  Behavior: Behavior normal.        Thought Content: Thought content normal.        Cognition and Memory: Cognition and memory normal.        Judgment: Judgment normal.    Assessment/Plan: 1. Gastroesophageal reflux disease without esophagitis Doing well with current dose pantoprazole. Continue as needed and as prescribed   2. Unspecified menopausal and perimenopausal disorder Check reproductive hormones when checking routine, fasting labs. Review at next visit.   3. Insomnia, unspecified type May continue to take alprazolam 0.25mg  at bedtime as needed for anxiety/insomnia.   General Counseling: Annick verbalizes understanding of the findings of todays visit and agrees with plan of treatment. I have discussed any further diagnostic evaluation that may be needed or ordered today. We also reviewed her medications today. she has been encouraged to call the office with any questions or concerns that should arise related to todays visit.   This patient was seen by Leretha Pol FNP Collaboration with Dr Lavera Guise as a part of collaborative care agreement   Total time spent: 25 Minutes   Time spent includes review of chart, medications, test results, and follow up plan with the patient.      Dr Lavera Guise Internal medicine

## 2019-12-19 DIAGNOSIS — N959 Unspecified menopausal and perimenopausal disorder: Secondary | ICD-10-CM | POA: Insufficient documentation

## 2020-02-14 ENCOUNTER — Other Ambulatory Visit: Payer: Self-pay

## 2020-02-14 DIAGNOSIS — K219 Gastro-esophageal reflux disease without esophagitis: Secondary | ICD-10-CM

## 2020-02-14 MED ORDER — PANTOPRAZOLE SODIUM 40 MG PO TBEC: 40.0000 mg | DELAYED_RELEASE_TABLET | Freq: Every day | ORAL | 1 refills | Status: DC

## 2020-03-27 ENCOUNTER — Other Ambulatory Visit: Payer: Self-pay | Admitting: Nurse Practitioner

## 2020-03-28 LAB — LIPID PANEL WITH LDL/HDL RATIO
Cholesterol, Total: 241 mg/dL — ABNORMAL HIGH (ref 100–199)
HDL: 136 mg/dL (ref >39)
LDL Chol Calc (NIH): 97 mg/dL (ref 0–99)
LDL/HDL Ratio: 0.7 ratio (ref 0.0–3.2)
Triglycerides: 48 mg/dL (ref 0–149)
VLDL Cholesterol Cal: 8 mg/dL (ref 5–40)

## 2020-03-28 LAB — COMPREHENSIVE METABOLIC PANEL
ALT: 13 IU/L (ref 0–32)
AST: 25 IU/L (ref 0–40)
Albumin/Globulin Ratio: 1.8 (ref 1.2–2.2)
Albumin: 4.4 g/dL (ref 3.8–4.9)
Alkaline Phosphatase: 68 IU/L (ref 48–121)
BUN/Creatinine Ratio: 9 — ABNORMAL LOW (ref 12–28)
BUN: 7 mg/dL — ABNORMAL LOW (ref 8–27)
Bilirubin Total: 0.6 mg/dL (ref 0.0–1.2)
CO2: 25 mmol/L (ref 20–29)
Calcium: 9.2 mg/dL (ref 8.7–10.3)
Chloride: 95 mmol/L — ABNORMAL LOW (ref 96–106)
Creatinine, Ser: 0.8 mg/dL (ref 0.57–1.00)
GFR calc Af Amer: 93 mL/min/{1.73_m2} (ref >59)
GFR calc non Af Amer: 80 mL/min/{1.73_m2} (ref >59)
Globulin, Total: 2.4 g/dL (ref 1.5–4.5)
Glucose: 94 mg/dL (ref 65–99)
Potassium: 4.3 mmol/L (ref 3.5–5.2)
Sodium: 132 mmol/L — ABNORMAL LOW (ref 134–144)
Total Protein: 6.8 g/dL (ref 6.0–8.5)

## 2020-03-28 LAB — CBC
Hematocrit: 38.2 % (ref 34.0–46.6)
Hemoglobin: 13 g/dL (ref 11.1–15.9)
MCH: 31 pg (ref 26.6–33.0)
MCHC: 34 g/dL (ref 31.5–35.7)
MCV: 91 fL (ref 79–97)
Platelets: 316 10*3/uL (ref 150–450)
RBC: 4.2 x10E6/uL (ref 3.77–5.28)
RDW: 11.9 % (ref 11.7–15.4)
WBC: 4.4 10*3/uL (ref 3.4–10.8)

## 2020-03-28 LAB — ESTRADIOL: Estradiol: <5 pg/mL

## 2020-03-28 LAB — FSH/LH
FSH: 94.1 m[IU]/mL
LH: 47.3 m[IU]/mL

## 2020-03-28 LAB — VITAMIN D 25 HYDROXY (VIT D DEFICIENCY, FRACTURES): Vit D, 25-Hydroxy: 37.9 ng/mL (ref 30.0–100.0)

## 2020-03-28 LAB — TSH: TSH: 1.32 u[IU]/mL (ref 0.450–4.500)

## 2020-03-28 LAB — T4, FREE: Free T4: 1.08 ng/dL (ref 0.82–1.77)

## 2020-04-15 ENCOUNTER — Encounter: Payer: Self-pay | Admitting: Nurse Practitioner

## 2020-04-15 ENCOUNTER — Ambulatory Visit (INDEPENDENT_AMBULATORY_CARE_PROVIDER_SITE_OTHER): Payer: BLUE CROSS/BLUE SHIELD | Admitting: Nurse Practitioner

## 2020-04-15 ENCOUNTER — Other Ambulatory Visit: Payer: Self-pay

## 2020-04-15 VITALS — BP 122/80 | HR 83 | Temp 97.5°F | Resp 16 | Ht 67.0 in | Wt 122.4 lb

## 2020-04-15 DIAGNOSIS — Z0001 Encounter for general adult medical examination with abnormal findings: Secondary | ICD-10-CM

## 2020-04-15 DIAGNOSIS — Z1231 Encounter for screening mammogram for malignant neoplasm of breast: Secondary | ICD-10-CM

## 2020-04-15 DIAGNOSIS — E871 Hypo-osmolality and hyponatremia: Secondary | ICD-10-CM | POA: Diagnosis not present

## 2020-04-15 DIAGNOSIS — R300 Dysuria: Secondary | ICD-10-CM

## 2020-04-15 DIAGNOSIS — G4700 Insomnia, unspecified: Secondary | ICD-10-CM | POA: Diagnosis not present

## 2020-04-15 DIAGNOSIS — Z124 Encounter for screening for malignant neoplasm of cervix: Secondary | ICD-10-CM

## 2020-04-15 MED ORDER — ALPRAZOLAM 0.25 MG PO TABS: 0.2500 mg | ORAL_TABLET | Freq: Every evening | ORAL | 3 refills | Status: DC | PRN

## 2020-04-15 MED ORDER — ALPRAZOLAM 0.25 MG PO TABS: ORAL_TABLET | ORAL | 2 refills | Status: DC

## 2020-04-15 NOTE — Progress Notes (Signed)
[facility] [place], [place] [address]  Internal MEDICINE  Office Visit Note  Patient Name: Sarah Clarke  [id number]  [id number]  Date of Service: 05/04/2020   Pt is here for routine health maintenance examination  Chief Complaint  Patient presents with  . Annual Exam     The patient is here for health maintenance exam and pap smear. She states that she feels well and has no concerns or complaints. She did have routine, fasting.labs done prior to this visit. Found that sodium and chloride levels were mildly low. She feels as though she may have been having some diarrhea that day, but no other symptoms. Her totoal cholesterol was elevated at 241, but remiainder of cholesterol panel was normal. Her HDL/LDL ratio was 0.7. No other problems to be bring up today. She is due to have a screening mammogram .    Current Medication: Outpatient Encounter Medications as of 04/15/2020  Medication Sig  . chlorpheniramine-HYDROcodone (TUSSIONEX PENNKINETIC ER) 10-8 MG/5ML SUER Take 5 mLs by mouth every 12 (twelve) hours as needed for cough.  . fluconazole (DIFLUCAN) 150 MG tablet Take 1 tablet po once. May repeat dose in 3 days as needed for persistent symptoms.  Marland Kitchen ibuprofen (ADVIL,MOTRIN) 600 MG tablet Take 600 mg by mouth as needed.   . pantoprazole (PROTONIX) 40 MG tablet Take 1 tablet (40 mg total) by mouth daily.  . [DISCONTINUED] ALPRAZolam (XANAX) 0.25 MG tablet Take 1 tablet (0.25 mg total) by mouth at bedtime as needed for anxiety.  . [DISCONTINUED] ALPRAZolam (XANAX) 0.25 MG tablet Take 1 tablet (0.25 mg total) by mouth at bedtime as needed for anxiety.  . ALPRAZolam (XANAX) 0.25 MG tablet Take one tab po qhs prn for insomnia   No facility-administered encounter medications on file as of 04/15/2020.    Surgical History: Past Surgical History:  Procedure Laterality Date  . ABDOMINAL HYSTERECTOMY     Partial  . BACK SURGERY    . COLON SURGERY    .  COLONOSCOPY WITH PROPOFOL N/A 07/14/2017   Procedure: COLONOSCOPY WITH PROPOFOL;  Surgeon: Jonathon Bellows, MD;  Location: [facility] ENDOSCOPY;  Service: Gastroenterology;  Laterality: N/A;  . LAPAROTOMY N/A 12/29/2014   Procedure: EXPLORATORY LAPAROTOMY with right hemicolectomy ;  Surgeon: Marlyce Huge, MD;  Location: [facility] ORS;  Service: General;  Laterality: N/A;  . neck fusion      Medical History: Past Medical History:  Diagnosis Date  . Anxiety   . Arthritis     Family History: Family History  Problem Relation Age of Onset  . Diabetes Mother   . Hypertension Father       Review of Systems  Constitutional: Negative for activity change, chills, fatigue and unexpected weight change.  HENT: Negative for congestion, postnasal drip, rhinorrhea, sneezing and sore throat.   Respiratory: Negative for cough, chest tightness and shortness of breath.   Cardiovascular: Negative for chest pain and palpitations.  Gastrointestinal: Negative for abdominal pain, constipation, diarrhea, nausea and vomiting.          Endocrine: Negative for cold intolerance, heat intolerance, polydipsia and polyuria.  Genitourinary: Negative for dyspareunia, dysuria and frequency.  Musculoskeletal: Negative for arthralgias, back pain, joint swelling and neck pain.  Skin: Negative for rash.  Allergic/Immunologic: Negative for environmental allergies.  Neurological: Negative for dizziness, tremors, numbness and headaches.  Hematological: Negative for adenopathy. Does not bruise/bleed easily.  Psychiatric/Behavioral: Negative for behavioral problems (Depression), sleep disturbance and suicidal ideas. The patient is nervous/anxious.  Very mild, intermittent anxiety. Takes alprazolam if needed at night.      Today's Vitals   04/15/20 0943  BP: 122/80  Pulse: 83  Resp: 16  Temp: (!) 97.5 F (36.4 C)  SpO2: 99%  Weight: 122 lb 6.4 oz (55.5 kg)  Height: 5' 7" (1.702 m)   Body mass index is 19.17  kg/m.  Physical Exam Vitals and nursing note reviewed.  Constitutional:      General: She is not in acute distress.    Appearance: Normal appearance. She is well-developed. She is not diaphoretic.  HENT:     Head: Normocephalic and atraumatic.     Nose: Nose normal.     Mouth/Throat:     Pharynx: No oropharyngeal exudate.  Eyes:     Pupils: Pupils are equal, round, and reactive to light.  Neck:     Thyroid: No thyromegaly.     Vascular: No JVD.     Trachea: No tracheal deviation.  Cardiovascular:     Rate and Rhythm: Normal rate and regular rhythm.     Pulses: Normal pulses.     Heart sounds: Normal heart sounds. No murmur heard.  No friction rub. No gallop.   Pulmonary:     Effort: Pulmonary effort is normal. No respiratory distress.     Breath sounds: Normal breath sounds. No wheezing or rales.  Chest:     Chest wall: No tenderness.     Breasts:        Right: Normal. No swelling, bleeding, inverted nipple, mass, nipple discharge, skin change or tenderness.        Left: Normal. No swelling, bleeding, inverted nipple, mass, nipple discharge, skin change or tenderness.  Abdominal:     General: Bowel sounds are normal.     Palpations: Abdomen is soft.     Tenderness: There is no abdominal tenderness.     Hernia: There is no hernia in the left inguinal area or right inguinal area.  Genitourinary:    Exam position: Supine.     Labia:        Right: No rash, tenderness or lesion.        Left: No rash, tenderness or lesion.      Vagina: Normal. No vaginal discharge, erythema, tenderness or bleeding.     Cervix: Normal.     Uterus: Normal.      Adnexa: Right adnexa normal and left adnexa normal.     Comments: No tenderness, masses, or organomeglay present during bimanual exam . Musculoskeletal:        General: Normal range of motion.     Cervical back: Normal range of motion and neck supple.  Lymphadenopathy:     Cervical: No cervical adenopathy.     Upper Body:     Right  upper body: No axillary adenopathy.     Left upper body: No axillary adenopathy.     Lower Body: No right inguinal adenopathy. No left inguinal adenopathy.  Skin:    General: Skin is warm and dry.     Capillary Refill: Capillary refill takes 2 to 3 seconds.  Neurological:     General: No focal deficit present.     Mental Status: She is alert and oriented to person, place, and time.     Cranial Nerves: No cranial nerve deficit.  Psychiatric:        Mood and Affect: Mood normal.        Behavior: Behavior normal.  Thought Content: Thought content normal.        Judgment: Judgment normal.      LABS: Recent Results (from the past 2160 hour(s))  Comprehensive metabolic panel     Status: Abnormal   Collection Time: 03/27/20 10:16 AM  Result Value Ref Range   Glucose 94 65 - 99 mg/dL   BUN 7 (L) 8 - 27 mg/dL   Creatinine, Ser 0.80 0.57 - 1.00 mg/dL   GFR calc non Af Amer 80 >59 mL/min/1.73   GFR calc Af Amer 93 >59 mL/min/1.73    Comment: **[facility] currently reports eGFR in compliance with the current**   recommendations of the National Kidney Foundation. [facility] will   update reporting as new guidelines are published from the NKF-ASN   Task force.    BUN/Creatinine Ratio 9 (L) 12 - 28   Sodium 132 (L) 134 - 144 mmol/L   Potassium 4.3 3.5 - 5.2 mmol/L   Chloride 95 (L) 96 - 106 mmol/L   CO2 25 20 - 29 mmol/L   Calcium 9.2 8.7 - 10.3 mg/dL   Total Protein 6.8 6.0 - 8.5 g/dL   Albumin 4.4 3.8 - 4.9 g/dL   Globulin, Total 2.4 1.5 - 4.5 g/dL   Albumin/Globulin Ratio 1.8 1.2 - 2.2   Bilirubin Total 0.6 0.0 - 1.2 mg/dL   Alkaline Phosphatase 68 48 - 121 IU/L    Comment: **Effective March 31, 2020 Alkaline Phosphatase**   reference interval will be changing to:              Age                Female          Female           0 -  5 days         47 - 127       47 - 127           6 - 10 days         29 - 242       29 - 242          11 - 20 days        109 - 357      109 -  357          21 - 30 days         94 - 494       94 - 494           1 -  2 months      149 - 539      149 - 539           3 -  6 months      131 - 452      131 - 452           7 - 11 months      117 - 401      117 - 401   12 months -  6 years       158 - 369      158 - 369           7 - 12 years       150 - 409      150 - 409               13 years         156 - 435       78 - 227               14 years       114 - 375       28 - 161               15 years        53 - 279       49 - 134               16 years        26 - 207       68 - 121               17 years        97 - 161       74 - 113          44 - 20 years        31 - 125       74 - 106              >20 years         63 - 121       78 - 121    AST 25 0 - 40 IU/L   ALT 13 0 - 32 IU/L  CBC     Status: None   Collection Time: 03/27/20 10:16 AM  Result Value Ref Range   WBC 4.4 3.4 - 10.8 x10E3/uL   RBC 4.20 3.77 - 5.28 x10E6/uL   Hemoglobin 13.0 11.1 - 15.9 g/dL   Hematocrit 38.2 34.0 - 46.6 %   MCV 91 79 - 97 fL   MCH 31.0 26.6 - 33.0 pg   MCHC 34.0 31 - 35 g/dL   RDW 11.9 11.7 - 15.4 %   Platelets 316 150 - 450 x10E3/uL  Lipid Panel With LDL/HDL Ratio     Status: Abnormal   Collection Time: 03/27/20 10:16 AM  Result Value Ref Range   Cholesterol, Total 241 (H) 100 - 199 mg/dL   Triglycerides 48 0 - 149 mg/dL   HDL 136 >39 mg/dL   VLDL Cholesterol Cal 8 5 - 40 mg/dL   LDL Chol Calc (NIH) 97 0 - 99 mg/dL   LDL/HDL Ratio 0.7 0.0 - 3.2 ratio    Comment:                                     LDL/HDL Ratio                                             Men  Women                               1/2 Avg.Risk  1.0    1.5                                   Avg.Risk  3.6    3.2  2X Avg.Risk  6.2    5.0                                3X Avg.Risk  8.0    6.1   FSH/LH     Status: None   Collection Time: 03/27/20 10:16 AM  Result Value Ref Range   LH 47.3 mIU/mL    Comment:                     Adult  Female:                       Follicular phase      2.4 -  12.6                       Ovulation phase      14.0 -  95.6                       Luteal phase          1.0 -  11.4                       Postmenopausal        7.7 -  58.5    FSH 94.1 mIU/mL    Comment:                     Adult Female:                       Follicular phase      3.5 -  12.5                       Ovulation phase       4.7 -  21.5                       Luteal phase          1.7 -   7.7                       Postmenopausal       25.8 - 134.8   T4, free     Status: None   Collection Time: 03/27/20 10:16 AM  Result Value Ref Range   Free T4 1.08 0.82 - 1.77 ng/dL  TSH     Status: None   Collection Time: 03/27/20 10:16 AM  Result Value Ref Range   TSH 1.320 0.450 - 4.500 uIU/mL  Estradiol     Status: None   Collection Time: 03/27/20 10:16 AM  Result Value Ref Range   Estradiol <5.0 pg/mL    Comment:                     Adult Female:                       Follicular phase   12.5 -   166.0                       Ovulation phase    85.8 -   498.0                         Luteal phase       43.8 -   211.0                       Postmenopausal     <6.0 -    54.7                     Pregnancy                       1st trimester     215.0 - >4300.0 Roche ECLIA methodology   VITAMIN D 25 Hydroxy (Vit-D Deficiency, Fractures)     Status: None   Collection Time: 03/27/20 10:16 AM  Result Value Ref Range   Vit D, 25-Hydroxy 37.9 30.0 - 100.0 ng/mL    Comment: Vitamin D deficiency has been defined by the [place] practice guideline as a level of serum 25-OH vitamin D less than 20 ng/mL (1,2). The Endocrine Society went on to further define vitamin D insufficiency as a level between 21 and 29 ng/mL (2). 1. IOM (Institute of Medicine). 2010. Dietary reference    intakes for calcium and D. [place]: The    [employer]. 2. Holick MF, [place], Bischoff-Ferrari HA,  et al.    Evaluation, treatment, and prevention of vitamin D    deficiency: an Endocrine Society clinical practice    guideline. JCEM. 2011 Jul; 96(7):1911-30.   IGP, Aptima HPV     Status: None   Collection Time: 04/15/20 12:43 PM  Result Value Ref Range   Interpretation NILM     Comment: NEGATIVE FOR INTRAEPITHELIAL LESION OR MALIGNANCY.   Category NIL     Comment: Negative for Intraepithelial Lesion   Adequacy ENDO     Comment: Satisfactory for evaluation. Endocervical and/or squamous metaplastic cells (endocervical component) are present.    Clinician Provided ICD10 Comment     Comment: Z12.4   Performed by: Comment     Comment: Raiford Noble, Cytotechnologist (ASCP)   Note: Comment     Comment: The Pap smear is a screening test designed to aid in the detection of premalignant and malignant conditions of the uterine cervix.  It is not a diagnostic procedure and should not be used as the sole means of detecting cervical cancer.  Both false-positive and false-negative reports do occur.    Test Methodology Comment     Comment: This liquid based ThinPrep(R) pap test was screened with the use of an image guided system.    HPV Aptima Negative Negative    Comment: This nucleic acid amplification test detects fourteen high-risk HPV types (16,18,31,33,35,39,45,51,52,56,58,59,66,68) without differentiation.   UA/M w/rflx Culture, Routine     Status: Abnormal   Collection Time: 04/15/20 12:43 PM   Specimen: Urine   Urine  Result Value Ref Range   Specific Gravity, UA 1.020 1.005 - 1.030   pH, UA 5.5 5.0 - 7.5   Color, UA Yellow Yellow   Appearance Ur Cloudy (A) Clear   Leukocytes,UA 2+ (A) Negative   Protein,UA Negative Negative/Trace   Glucose, UA Negative Negative   Ketones, UA Trace (A) Negative   RBC, UA Negative Negative   Bilirubin, UA Negative Negative   Urobilinogen, Ur 0.2 0.2 - 1.0 mg/dL   Nitrite, UA Negative Negative   Microscopic Examination See below:      Comment: Microscopic was indicated and was performed.   Urinalysis Reflex Comment  Comment: This specimen has reflexed to a Urine Culture.  Microscopic Examination     Status: Abnormal   Collection Time: 04/15/20 12:43 PM   Urine  Result Value Ref Range   WBC, UA 6-10 (A) 0 - 5 /hpf   RBC 0-2 0 - 2 /hpf   Epithelial Cells (non renal) 0-10 0 - 10 /hpf   Casts None seen None seen /lpf   Bacteria, UA None seen None seen/Few  Urine Culture, Reflex     Status: None   Collection Time: 04/15/20 12:43 PM   Urine  Result Value Ref Range   Urine Culture, Routine Final report    Organism ID, Bacteria No growth     Assessment/Plan:  1. Encounter for general adult medical examination with abnormal findings Annual health maintenance exam and pap smear today.   2. Hyponatremia Encouraged the patient to increase fluid intake. Recheck bmp prior to next visit for further evaluation.   3. Insomnia, unspecified type May take alprazolam on PRN basis at night and as needed for anxiety/insomnia   4. Routine cervical smear - IGP, Aptima HPV  5. Encounter for screening mammogram for malignant neoplasm of breast - MM DIGITAL SCREENING BILATERAL; Future  6. Dysuria - UA/M w/rflx Culture, Routine   General Counseling: Stina verbalizes understanding of the findings of todays visit and agrees with plan of treatment. I have discussed any further diagnostic evaluation that may be needed or ordered today. We also reviewed her medications today. she has been encouraged to call the office with any questions or concerns that should arise related to todays visit.    Counseling:  This patient was seen by Heather Boscia FNP Collaboration with Dr Fozia M Khan as a part of collaborative care agreement  Orders Placed This Encounter  Procedures  . Microscopic Examination  . Urine Culture, Reflex  . MM DIGITAL SCREENING BILATERAL  . UA/M w/rflx Culture, Routine    Meds ordered this encounter   Medications  . DISCONTD: ALPRAZolam (XANAX) 0.25 MG tablet    Sig: Take 1 tablet (0.25 mg total) by mouth at bedtime as needed for anxiety.    Dispense:  30 tablet    Refill:  3  . ALPRAZolam (XANAX) 0.25 MG tablet    Sig: Take one tab po qhs prn for insomnia    Dispense:  30 tablet    Refill:  2    Total time spent: 45 Minutes  Time spent includes review of chart, medications, test results, and follow up plan with the patient.     Fozia M Khan, MD  Internal Medicine  

## 2020-04-16 NOTE — Progress Notes (Signed)
Reviewed with patient at visit. Recheck BMP due to low sodium

## 2020-04-16 NOTE — Progress Notes (Signed)
Wait on culture and sensitivity report.

## 2020-04-18 LAB — UA/M W/RFLX CULTURE, ROUTINE
Bilirubin, UA: NEGATIVE
Glucose, UA: NEGATIVE
Nitrite, UA: NEGATIVE
Protein,UA: NEGATIVE
RBC, UA: NEGATIVE
Specific Gravity, UA: 1.02 (ref 1.005–1.030)
Urobilinogen, Ur: 0.2 mg/dL (ref 0.2–1.0)
pH, UA: 5.5 (ref 5.0–7.5)

## 2020-04-18 LAB — MICROSCOPIC EXAMINATION
Bacteria, UA: NONE SEEN
Casts: NONE SEEN /LPF

## 2020-04-18 LAB — URINE CULTURE, REFLEX: Organism ID, Bacteria: NO GROWTH

## 2020-04-18 NOTE — Progress Notes (Signed)
No bacterial growth

## 2020-04-19 LAB — IGP, APTIMA HPV: HPV Aptima: NEGATIVE

## 2020-04-25 ENCOUNTER — Other Ambulatory Visit: Payer: Self-pay | Admitting: Nurse Practitioner

## 2020-04-26 LAB — BASIC METABOLIC PANEL
BUN/Creatinine Ratio: 7 — ABNORMAL LOW (ref 12–28)
BUN: 5 mg/dL — ABNORMAL LOW (ref 8–27)
CO2: 23 mmol/L (ref 20–29)
Calcium: 9.7 mg/dL (ref 8.7–10.3)
Chloride: 95 mmol/L — ABNORMAL LOW (ref 96–106)
Creatinine, Ser: 0.7 mg/dL (ref 0.57–1.00)
GFR calc Af Amer: 109 mL/min/{1.73_m2} (ref >59)
GFR calc non Af Amer: 94 mL/min/{1.73_m2} (ref >59)
Glucose: 91 mg/dL (ref 65–99)
Potassium: 4.4 mmol/L (ref 3.5–5.2)
Sodium: 135 mmol/L (ref 134–144)

## 2020-04-27 NOTE — Progress Notes (Signed)
Please let the patient know that her pap smear was normal. Thanks.

## 2020-04-29 ENCOUNTER — Telehealth: Payer: Self-pay

## 2020-04-29 NOTE — Telephone Encounter (Signed)
-----   Message from Ronnell Freshwater, NP sent at 04/27/2020  8:51 PM EDT ----- Please let the patient know that her pap smear was normal. Thanks.

## 2020-04-29 NOTE — Telephone Encounter (Signed)
PT notified

## 2020-04-30 ENCOUNTER — Telehealth: Payer: Self-pay

## 2020-04-30 NOTE — Telephone Encounter (Signed)
[facility] received referral, Pt needs to call back to schedule appt

## 2020-05-04 DIAGNOSIS — E871 Hypo-osmolality and hyponatremia: Secondary | ICD-10-CM | POA: Insufficient documentation

## 2020-05-04 DIAGNOSIS — Z124 Encounter for screening for malignant neoplasm of cervix: Secondary | ICD-10-CM | POA: Insufficient documentation

## 2020-05-05 NOTE — Progress Notes (Signed)
Labs improved

## 2020-07-15 ENCOUNTER — Ambulatory Visit: Payer: BLUE CROSS/BLUE SHIELD | Admitting: Nurse Practitioner

## 2020-07-15 ENCOUNTER — Encounter: Payer: Self-pay | Admitting: Nurse Practitioner

## 2020-07-15 ENCOUNTER — Other Ambulatory Visit: Payer: Self-pay

## 2020-07-15 ENCOUNTER — Ambulatory Visit
Admission: RE | Admit: 2020-07-15 | Discharge: 2020-07-15 | Disposition: A | Discharge status: Home / Self Care | Payer: BLUE CROSS/BLUE SHIELD | Source: Ambulatory Visit | Attending: Nurse Practitioner | Admitting: Nurse Practitioner

## 2020-07-15 ENCOUNTER — Ambulatory Visit
Admission: RE | Admit: 2020-07-15 | Discharge: 2020-07-15 | Disposition: A | Discharge status: Home / Self Care | Payer: BLUE CROSS/BLUE SHIELD | Attending: Nurse Practitioner | Admitting: Nurse Practitioner

## 2020-07-15 VITALS — BP 128/87 | HR 93 | Temp 97.8°F | Resp 16 | Ht 67.0 in | Wt 127.4 lb

## 2020-07-15 DIAGNOSIS — M25551 Pain in right hip: Secondary | ICD-10-CM | POA: Insufficient documentation

## 2020-07-15 DIAGNOSIS — K219 Gastro-esophageal reflux disease without esophagitis: Secondary | ICD-10-CM | POA: Diagnosis not present

## 2020-07-15 DIAGNOSIS — G4700 Insomnia, unspecified: Secondary | ICD-10-CM | POA: Diagnosis not present

## 2020-07-15 MED ORDER — ALPRAZOLAM 0.25 MG PO TABS: ORAL_TABLET | ORAL | 2 refills | Status: DC

## 2020-07-15 MED ORDER — METHYLPREDNISOLONE 4 MG PO TBPK: ORAL_TABLET | ORAL | 0 refills | Status: DC

## 2020-07-15 NOTE — Progress Notes (Signed)
Please let the patient know that her x-ray was negative. If pain is persistent after she finishes the medrol taper, will need to refer her to orthopedics. Thanks.

## 2020-07-15 NOTE — Progress Notes (Signed)
[facility] [address] [place], [place] [address]  Internal MEDICINE  Office Visit Note  Patient Name: Sarah Clarke  [id number]  [id number]  Date of Service: 07/15/2020  Chief Complaint  Patient presents with  . Hip Pain    The patient is here for routine follow up. She state that she has pain in right hip. Pain starts in posterior hip and hurts along outer aspect of the hip. This is right along the bony protuberance. Hurts her to lift the right leg. Has been like this for a couple of weeks and has gradually become worse. She denies injury or trauma. She states that she has been to the chiropractor a few times to get "adjusted" but this has not done much to help. She has tried taking ibuprofen to help. Does a little, but doesn't want to take too often as she has history of gastric ulceration.  The patient does take alprazolam 0.25mg  on some nights to help her mind settle and rest well. Her fill history was reviewed per PDMP profile. Last vill ofalprazolam was 05/15/2020. She does need to have refill for this today.        Current Medication: Outpatient Encounter Medications as of 07/15/2020  Medication Sig  . ibuprofen (ADVIL,MOTRIN) 600 MG tablet Take 600 mg by mouth as needed.   . methylPREDNISolone (MEDROL) 4 MG TBPK tablet Take by mouth as directed for 6 days  . pantoprazole (PROTONIX) 40 MG tablet Take 1 tablet (40 mg total) by mouth daily.  . [DISCONTINUED] ALPRAZolam (XANAX) 0.25 MG tablet Take one tab po qhs prn for insomnia  . [DISCONTINUED] chlorpheniramine-HYDROcodone (TUSSIONEX PENNKINETIC ER) 10-8 MG/5ML SUER Take 5 mLs by mouth every 12 (twelve) hours as needed for cough.  . [DISCONTINUED] fluconazole (DIFLUCAN) 150 MG tablet Take 1 tablet po once. May repeat dose in 3 days as needed for persistent symptoms.  . ALPRAZolam (XANAX) 0.25 MG tablet Take one tab po qhs prn for insomnia   No facility-administered encounter medications on file as of  07/15/2020.    Surgical History: Past Surgical History:  Procedure Laterality Date  . ABDOMINAL HYSTERECTOMY     Partial  . BACK SURGERY    . COLON SURGERY    . COLONOSCOPY WITH PROPOFOL N/A 07/14/2017   Procedure: COLONOSCOPY WITH PROPOFOL;  Surgeon: Wyline Mood, MD;  Location: [facility] ENDOSCOPY;  Service: Gastroenterology;  Laterality: N/A;  . LAPAROTOMY N/A 12/29/2014   Procedure: EXPLORATORY LAPAROTOMY with right hemicolectomy ;  Surgeon: Ida Rogue, MD;  Location: [facility] ORS;  Service: General;  Laterality: N/A;  . neck fusion      Medical History: Past Medical History:  Diagnosis Date  . Anxiety   . Arthritis     Family History: Family History  Problem Relation Age of Onset  . Diabetes Mother   . Hypertension Father     Social History   Socioeconomic History  . Marital status: Divorced    Spouse name: Not on file  . Number of children: Not on file  . Years of education: Not on file  . Highest education level: Not on file  Occupational History  . Not on file  Tobacco Use  . Smoking status: Never Smoker  . Smokeless tobacco: Never Used  Vaping Use  . Vaping Use: Never used  Substance and Sexual Activity  . Alcohol use: Yes    Comment: social  . Drug use: No  . Sexual activity: Not Currently  Other Topics Concern  . Not  on file  Social History Narrative  . Not on file   Social Determinants of Health   Financial Resource Strain: Not on file  Food Insecurity: Not on file  Transportation Needs: Not on file  Physical Activity: Not on file  Stress: Not on file  Social Connections: Not on file  Intimate Partner Violence: Not on file      Review of Systems  Constitutional: Negative for activity change, chills, fatigue and unexpected weight change.  HENT: Negative for congestion, postnasal drip, rhinorrhea, sneezing and sore throat.   Eyes: Negative for redness.  Respiratory: Negative for cough, chest tightness and shortness of breath.    Cardiovascular: Negative for chest pain and palpitations.  Gastrointestinal: Negative for abdominal pain, constipation, diarrhea, nausea and vomiting.  Endocrine: Negative for cold intolerance, heat intolerance, polydipsia and polyuria.  Musculoskeletal: Positive for arthralgias. Negative for back pain, joint swelling and neck pain.  Skin: Negative for rash.  Allergic/Immunologic: Negative for environmental allergies.  Neurological: Negative for dizziness, tremors, numbness and headaches.  Hematological: Negative for adenopathy. Does not bruise/bleed easily.  Psychiatric/Behavioral: Positive for sleep disturbance. Negative for behavioral problems (Depression) and suicidal ideas. The patient is nervous/anxious.     Today's Vitals   07/15/20 0908  BP: 128/87  Pulse: 93  Resp: 16  Temp: 97.8 F (36.6 C)  SpO2: 99%  Weight: 127 lb 6.4 oz (57.8 kg)  Height: 5\' 7"  (1.702 m)   Body mass index is 19.95 kg/m.  Physical Exam Vitals and nursing note reviewed.  Constitutional:      General: She is not in acute distress.    Appearance: Normal appearance. She is well-developed and well-nourished. She is not diaphoretic.  HENT:     Head: Normocephalic and atraumatic.     Mouth/Throat:     Mouth: Oropharynx is clear and moist.     Pharynx: No oropharyngeal exudate.  Eyes:     Extraocular Movements: EOM normal.     Pupils: Pupils are equal, round, and reactive to light.  Neck:     Thyroid: No thyromegaly.     Vascular: No carotid bruit or JVD.     Trachea: No tracheal deviation.  Cardiovascular:     Rate and Rhythm: Normal rate and regular rhythm.     Heart sounds: Normal heart sounds. No murmur heard. No friction rub. No gallop.   Pulmonary:     Effort: Pulmonary effort is normal. No respiratory distress.     Breath sounds: Normal breath sounds. No wheezing or rales.  Chest:     Chest wall: No tenderness.  Abdominal:     Palpations: Abdomen is soft.  Musculoskeletal:         General: Normal range of motion.     Cervical back: Normal range of motion and neck supple.     Comments: Tenderness along the lateral aspect of the right hip. Patient having a difficult time being seated comfortably. No bony abnormalities or deformities are noted   Lymphadenopathy:     Cervical: No cervical adenopathy.  Skin:    General: Skin is warm and dry.  Neurological:     Mental Status: She is alert and oriented to person, place, and time.     Cranial Nerves: No cranial nerve deficit.  Psychiatric:        Mood and Affect: Mood and affect normal.        Behavior: Behavior normal.        Thought Content: Thought content normal.  Judgment: Judgment normal.    Assessment/Plan: 1. Gastroesophageal reflux disease without esophagitis The patient is doing well on protonix. Continue as prescribed   2. Acute right hip pain Add medrol dose pack. Take as directed for 6 days. Continue to apply topical pain medication and take ibuprofen as needed and as prescribed. Will get x-ray of right hip for further evaluation.  - DG HIP UNILAT WITH PELVIS 2-3 VIEWS RIGHT; Future - methylPREDNISolone (MEDROL) 4 MG TBPK tablet; Take by mouth as directed for 6 days  Dispense: 21 tablet; Refill: 0  3. Insomnia, unspecified type Fill history verified per PDMP profile. May take alprazolam 0.25mg  at bedtime as needed, New prescription sent to her pharmacy today.  - ALPRAZolam (XANAX) 0.25 MG tablet; Take one tab po qhs prn for insomnia  Dispense: 30 tablet; Refill: 2  General Counseling: Temara verbalizes understanding of the findings of todays visit and agrees with plan of treatment. I have discussed any further diagnostic evaluation that may be needed or ordered today. We also reviewed her medications today. she has been encouraged to call the office with any questions or concerns that should arise related to todays visit.  This patient was seen by Leretha Pol FNP Collaboration with Dr Lavera Guise as a part of collaborative care agreement  Orders Placed This Encounter  Procedures  . DG HIP UNILAT WITH PELVIS 2-3 VIEWS RIGHT    Meds ordered this encounter  Medications  . methylPREDNISolone (MEDROL) 4 MG TBPK tablet    Sig: Take by mouth as directed for 6 days    Dispense:  21 tablet    Refill:  0    Order Specific Question:   Supervising Provider    Answer:   Lavera Guise [place]  . ALPRAZolam (XANAX) 0.25 MG tablet    Sig: Take one tab po qhs prn for insomnia    Dispense:  30 tablet    Refill:  2    Order Specific Question:   Supervising Provider    Answer:   Lavera Guise [id number]    Total time spent: 30 Minutes   Time spent includes review of chart, medications, test results, and follow up plan with the patient.      Dr Lavera Guise Internal medicine

## 2020-08-07 ENCOUNTER — Other Ambulatory Visit: Payer: Self-pay

## 2020-08-07 ENCOUNTER — Encounter: Payer: Self-pay | Admitting: Physician Assistant

## 2020-08-07 ENCOUNTER — Ambulatory Visit (INDEPENDENT_AMBULATORY_CARE_PROVIDER_SITE_OTHER): Payer: BLUE CROSS/BLUE SHIELD | Admitting: Internal Medicine

## 2020-08-07 VITALS — Temp 98.7°F | Ht 67.0 in | Wt 125.0 lb

## 2020-08-07 DIAGNOSIS — R6889 Other general symptoms and signs: Secondary | ICD-10-CM

## 2020-08-07 DIAGNOSIS — K219 Gastro-esophageal reflux disease without esophagitis: Secondary | ICD-10-CM

## 2020-08-07 MED ORDER — PANTOPRAZOLE SODIUM 40 MG PO TBEC: 40.0000 mg | DELAYED_RELEASE_TABLET | Freq: Every day | ORAL | 1 refills | Status: DC

## 2020-08-07 MED ORDER — OSELTAMIVIR PHOSPHATE 75 MG PO CAPS: 75.0000 mg | ORAL_CAPSULE | Freq: Two times a day (BID) | ORAL | 0 refills | Status: DC

## 2020-08-07 NOTE — Progress Notes (Signed)
[facility] [place], [place] [address]  Internal MEDICINE  Telephone Visit  Patient Name: Sarah Clarke  [id number]  [id number]  Date of Service: 08/08/2020  I connected with the patient at 1050 by telephone and verified the patients identity using two identifiers.   I discussed the limitations, risks, security and privacy concerns of performing an evaluation and management service by telephone and the availability of in person appointments. I also discussed with the patient that there may be a patient responsible charge related to the service.  The patient expressed understanding and agrees to proceed.    Chief Complaint  Patient presents with  . Telephone Assessment    [phone]  . Telephone Screen  . Headache  . Cough  . Sinusitis    HPI Connected via tele visit for acute and sick visit. C/O Cough and congestion, fever as well. She has the worst body aches, did do a home covid test which is negative  She is not vaccinated for COVID however did contact it last year. No recent exposure to flu    Current Medication: Outpatient Encounter Medications as of 08/07/2020  Medication Sig  . ALPRAZolam (XANAX) 0.25 MG tablet Take one tab po qhs prn for insomnia  . ibuprofen (ADVIL,MOTRIN) 600 MG tablet Take 600 mg by mouth as needed.   Marland Kitchen oseltamivir (TAMIFLU) 75 MG capsule Take 1 capsule (75 mg total) by mouth 2 (two) times daily.  . pantoprazole (PROTONIX) 40 MG tablet Take 1 tablet (40 mg total) by mouth daily.  . [DISCONTINUED] methylPREDNISolone (MEDROL) 4 MG TBPK tablet Take by mouth as directed for 6 days   No facility-administered encounter medications on file as of 08/07/2020.    Surgical History: Past Surgical History:  Procedure Laterality Date  . ABDOMINAL HYSTERECTOMY     Partial  . BACK SURGERY    . COLON SURGERY    . COLONOSCOPY WITH PROPOFOL N/A 07/14/2017   Procedure: COLONOSCOPY WITH PROPOFOL;  Surgeon: Jonathon Bellows, MD;   Location: [facility] ENDOSCOPY;  Service: Gastroenterology;  Laterality: N/A;  . LAPAROTOMY N/A 12/29/2014   Procedure: EXPLORATORY LAPAROTOMY with right hemicolectomy ;  Surgeon: Marlyce Huge, MD;  Location: [facility] ORS;  Service: General;  Laterality: N/A;  . neck fusion      Medical History: Past Medical History:  Diagnosis Date  . Anxiety   . Arthritis     Family History: Family History  Problem Relation Age of Onset  . Diabetes Mother   . Hypertension Father     Social History   Socioeconomic History  . Marital status: Divorced    Spouse name: Not on file  . Number of children: Not on file  . Years of education: Not on file  . Highest education level: Not on file  Occupational History  . Not on file  Tobacco Use  . Smoking status: Never Smoker  . Smokeless tobacco: Never Used  Vaping Use  . Vaping Use: Never used  Substance and Sexual Activity  . Alcohol use: Yes    Comment: social  . Drug use: No  . Sexual activity: Not Currently  Other Topics Concern  . Not on file  Social History Narrative  . Not on file   Social Determinants of Health   Financial Resource Strain: Not on file  Food Insecurity: Not on file  Transportation Needs: Not on file  Physical Activity: Not on file  Stress: Not on file  Social Connections: Not on file  Intimate Partner  Violence: Not on file      Review of Systems  Constitutional: Positive for chills, fatigue and fever.  HENT: Positive for sore throat.   Respiratory: Positive for cough. Negative for wheezing.   Cardiovascular: Negative.   Musculoskeletal: Positive for arthralgias.  Neurological: Negative.   Psychiatric/Behavioral: Negative.     Vital Signs: Temp 98.7 F (37.1 C)   Ht 5\' 7"  (1.702 m)   Wt 125 lb (56.7 kg)   BMI 19.58 kg/m    Observation/Objective: Pt is NAD but sounds congested and sick     Assessment/Plan: 1. Flu-like symptoms Will start Tamiflu today and follow up in the office for  testing, rest and increase fluid intake   General Counseling: [name] verbalizes understanding of the findings of today's phone visit and agrees with plan of treatment. I have discussed any further diagnostic evaluation that may be needed or ordered today. We also reviewed her medications today. she has been encouraged to call the office with any questions or concerns that should arise related to todays visit.  Meds ordered this encounter  Medications  . oseltamivir (TAMIFLU) 75 MG capsule    Sig: Take 1 capsule (75 mg total) by mouth 2 (two) times daily.    Dispense:  10 capsule    Refill:  0    Time spent:10 Minutes    Dr Lavera Guise Internal medicine

## 2020-08-08 ENCOUNTER — Ambulatory Visit (INDEPENDENT_AMBULATORY_CARE_PROVIDER_SITE_OTHER): Payer: BLUE CROSS/BLUE SHIELD | Admitting: Physician Assistant

## 2020-08-08 DIAGNOSIS — R6889 Other general symptoms and signs: Secondary | ICD-10-CM

## 2020-08-08 DIAGNOSIS — R059 Cough, unspecified: Secondary | ICD-10-CM

## 2020-08-08 MED ORDER — IPRATROPIUM-ALBUTEROL 0.5-2.5 (3) MG/3ML IN SOLN
3.0000 mL | Freq: Once | RESPIRATORY_TRACT | Status: AC
  Administered 2020-08-08: 3 mL via RESPIRATORY_TRACT

## 2020-08-08 MED ORDER — HYDROCOD POLST-CPM POLST ER 10-8 MG/5ML PO SUER
5.0000 mL | Freq: Two times a day (BID) | ORAL | 0 refills | Status: DC | PRN
Start: 2020-08-08 — End: 2020-10-13

## 2020-08-08 NOTE — Progress Notes (Signed)
[facility] [place], [place] [address]  Internal MEDICINE  Office Visit Note  Patient Name: Sarah Clarke  [id number]  [id number]  Date of Service: 08/08/2020  Chief Complaint  Patient presents with  . Follow-up    Wednesday pt started to feel fatigued, headache, muscle spasms, neg. Covid test yesterday, head congestion, cough with phlegm, ears hurt  . Anxiety     HPI Pt is here for a sick visit. She is following up from telehealth consult yesterday. Will do flu swab today. Home covid test was negative. She is not vaccinated, but did have covid last year. Feeling better since tamiflu started but still having a lot of congestion and is coughing up phlegm. Body aches improved some, but headache and sinus pressure. Normally does not get headaches. Tried some zyrtec and flonase for sinus. No SOB or wheezing. Has not tried mucinex, it upsets her.  She is not a smoker.  Current Medication:  Outpatient Encounter Medications as of 08/08/2020  Medication Sig  . ALPRAZolam (XANAX) 0.25 MG tablet Take one tab po qhs prn for insomnia  . ibuprofen (ADVIL,MOTRIN) 600 MG tablet Take 600 mg by mouth as needed.   Marland Kitchen oseltamivir (TAMIFLU) 75 MG capsule Take 1 capsule (75 mg total) by mouth 2 (two) times daily.  . pantoprazole (PROTONIX) 40 MG tablet Take 1 tablet (40 mg total) by mouth daily.   No facility-administered encounter medications on file as of 08/08/2020.      Medical History: Past Medical History:  Diagnosis Date  . Anxiety   . Arthritis      Vital Signs: BP 125/83   Pulse 100   Temp (!) 97.2 F (36.2 C)   Resp 16   Ht 5\' 7"  (1.702 m)   Wt 125 lb (56.7 kg)   SpO2 98%   BMI 19.58 kg/m    Review of Systems  Constitutional: Positive for chills and fatigue. Negative for fever.  HENT: Positive for congestion, postnasal drip, rhinorrhea, sinus pressure and sinus pain. Negative for mouth sores.   Respiratory: Positive for cough. Negative for  shortness of breath and wheezing.   Cardiovascular: Negative for chest pain.  Gastrointestinal: Negative for abdominal pain, diarrhea, nausea and vomiting.  Genitourinary: Negative for flank pain.  Musculoskeletal: Positive for myalgias.  Skin: Negative.   Neurological: Positive for headaches.  Psychiatric/Behavioral: Negative.  The patient is not nervous/anxious.     Physical Exam Vitals reviewed.  Constitutional:      Appearance: Normal appearance.  HENT:     Head: Normocephalic and atraumatic.     Right Ear: Tympanic membrane normal.     Left Ear: Tympanic membrane normal.     Nose: Congestion present.  Eyes:     Extraocular Movements: Extraocular movements intact.     Pupils: Pupils are equal, round, and reactive to light.  Cardiovascular:     Rate and Rhythm: Normal rate and regular rhythm.     Pulses: Normal pulses.     Heart sounds: Normal heart sounds.  Pulmonary:     Effort: Pulmonary effort is normal.     Breath sounds: Normal breath sounds.  Abdominal:     General: Abdomen is flat.  Musculoskeletal:        General: Normal range of motion.     Cervical back: Normal range of motion.  Skin:    General: Skin is warm and dry.  Neurological:     General: No focal deficit present.  Mental Status: She is alert.  Psychiatric:        Mood and Affect: Mood normal.        Behavior: Behavior normal.       Assessment/Plan: 1. Flu-like symptoms Flu test negative in office, but improving symptoms on tamiflu. She is still having a lot of drainage and coughing. Duoneb given in office and will start Tussionex to help with cough as needed. - POCT Influenza A/B - chlorpheniramine-HYDROcodone (TUSSIONEX PENNKINETIC ER) 10-8 MG/5ML SUER; Take 5 mLs by mouth every 12 (twelve) hours as needed for cough.  Dispense: 140 mL; Refill: 0 - ipratropium-albuterol (DUONEB) 0.5-2.5 (3) MG/3ML nebulizer solution 3 mL  General Counseling: Sarah Clarke verbalizes understanding of the  findings of todays visit and agrees with plan of treatment. I have discussed any further diagnostic evaluation that may be needed or ordered today. We also reviewed her medications today. she has been encouraged to call the office with any questions or concerns that should arise related to todays visit.    Counseling:    Orders Placed This Encounter  Procedures  . POCT Influenza A/B    No orders of the defined types were placed in this encounter.   Time spent:30 Minutes

## 2020-08-15 LAB — POCT INFLUENZA A/B
Influenza A, POC: NEGATIVE
Influenza B, POC: NEGATIVE

## 2020-10-13 ENCOUNTER — Ambulatory Visit (INDEPENDENT_AMBULATORY_CARE_PROVIDER_SITE_OTHER): Payer: BLUE CROSS/BLUE SHIELD | Admitting: Hospice and Palliative Medicine

## 2020-10-13 ENCOUNTER — Other Ambulatory Visit: Payer: Self-pay

## 2020-10-13 ENCOUNTER — Encounter: Payer: Self-pay | Admitting: Hospice and Palliative Medicine

## 2020-10-13 VITALS — BP 140/90 | HR 87 | Temp 97.3°F | Resp 16 | Ht 67.0 in | Wt 127.8 lb

## 2020-10-13 DIAGNOSIS — N941 Unspecified dyspareunia: Secondary | ICD-10-CM | POA: Diagnosis not present

## 2020-10-13 DIAGNOSIS — R102 Pelvic and perineal pain: Secondary | ICD-10-CM

## 2020-10-13 DIAGNOSIS — M25551 Pain in right hip: Secondary | ICD-10-CM

## 2020-10-13 DIAGNOSIS — Z79899 Other long term (current) drug therapy: Secondary | ICD-10-CM | POA: Diagnosis not present

## 2020-10-13 DIAGNOSIS — G4700 Insomnia, unspecified: Secondary | ICD-10-CM | POA: Diagnosis not present

## 2020-10-13 LAB — POCT URINE DRUG SCREEN
POC Amphetamine UR: NOT DETECTED
POC BENZODIAZEPINES UR: POSITIVE — AB
POC Barbiturate UR: NOT DETECTED
POC Cocaine UR: NOT DETECTED
POC Ecstasy UR: NOT DETECTED
POC Marijuana UR: NOT DETECTED
POC Methadone UR: NOT DETECTED
POC Methamphetamine UR: NOT DETECTED
POC Opiate Ur: NOT DETECTED
POC Oxycodone UR: NOT DETECTED
POC PHENCYCLIDINE UR: NOT DETECTED
POC TRICYCLICS UR: NOT DETECTED

## 2020-10-13 MED ORDER — ALPRAZOLAM 0.25 MG PO TABS: ORAL_TABLET | ORAL | 2 refills | Status: DC

## 2020-10-13 NOTE — Progress Notes (Signed)
[facility] [place], [place] [address]  Internal MEDICINE  Office Visit Note  Patient Name: Sarah Clarke  [id number]  [id number]  Date of Service: 10/14/2020  Chief Complaint  Patient presents with  . Follow-up    Pelvic pain right side goes through to lower back, hurts during sex, worse when laying done and lifting right leg  . Anxiety    HPI Patient is here for routine follow-up Continues to complain of right sided pelvic pain--has been ongoing for several months, has had right hip xray which was normal Pain seems to be worsening and becoming more frequent Now experiencing pain and discomfort with sexual intercourse History of partial hysterectomy but ovaries remain She has tried chiropractic and massage therapy which has not provided her any relief in her pain Denies abnormal vaginal bleeding or discharge  Requesting refills of alprazolam that she continues to take as needed at night for insomnia, takes most nights of the week but will skip a few nights Low dose of alprazolam, feels her stress and anxiety are well controlled during the day Has trouble at nights getting her mind to stop, worries about tasks she did not get completed for the day  Current Medication: Outpatient Encounter Medications as of 10/13/2020  Medication Sig  . ibuprofen (ADVIL,MOTRIN) 600 MG tablet Take 600 mg by mouth as needed.   . pantoprazole (PROTONIX) 40 MG tablet Take 1 tablet (40 mg total) by mouth daily.  . [DISCONTINUED] ALPRAZolam (XANAX) 0.25 MG tablet Take one tab po qhs prn for insomnia  . [DISCONTINUED] chlorpheniramine-HYDROcodone (TUSSIONEX PENNKINETIC ER) 10-8 MG/5ML SUER Take 5 mLs by mouth every 12 (twelve) hours as needed for cough.  . ALPRAZolam (XANAX) 0.25 MG tablet Take one tab po qhs prn for insomnia  . [DISCONTINUED] oseltamivir (TAMIFLU) 75 MG capsule Take 1 capsule (75 mg total) by mouth 2 (two) times daily.   No facility-administered encounter  medications on file as of 10/13/2020.    Surgical History: Past Surgical History:  Procedure Laterality Date  . ABDOMINAL HYSTERECTOMY     Partial  . BACK SURGERY    . COLON SURGERY    . COLONOSCOPY WITH PROPOFOL N/A 07/14/2017   Procedure: COLONOSCOPY WITH PROPOFOL;  Surgeon: Jonathon Bellows, MD;  Location: [facility] ENDOSCOPY;  Service: Gastroenterology;  Laterality: N/A;  . LAPAROTOMY N/A 12/29/2014   Procedure: EXPLORATORY LAPAROTOMY with right hemicolectomy ;  Surgeon: Marlyce Huge, MD;  Location: [facility] ORS;  Service: General;  Laterality: N/A;  . neck fusion      Medical History: Past Medical History:  Diagnosis Date  . Anxiety   . Arthritis     Family History: Family History  Problem Relation Age of Onset  . Diabetes Mother   . Hypertension Father     Social History   Socioeconomic History  . Marital status: Divorced    Spouse name: Not on file  . Number of children: Not on file  . Years of education: Not on file  . Highest education level: Not on file  Occupational History  . Not on file  Tobacco Use  . Smoking status: Never Smoker  . Smokeless tobacco: Never Used  Vaping Use  . Vaping Use: Never used  Substance and Sexual Activity  . Alcohol use: Yes    Comment: social  . Drug use: No  . Sexual activity: Not Currently  Other Topics Concern  . Not on file  Social History Narrative  . Not on file  Social Determinants of Health   Financial Resource Strain: Not on file  Food Insecurity: Not on file  Transportation Needs: Not on file  Physical Activity: Not on file  Stress: Not on file  Social Connections: Not on file  Intimate Partner Violence: Not on file      Review of Systems  Constitutional: Negative for chills, diaphoresis and fatigue.  HENT: Negative for ear pain, postnasal drip and sinus pressure.   Eyes: Negative for photophobia, discharge, redness, itching and visual disturbance.  Respiratory: Negative for cough, shortness of breath  and wheezing.   Cardiovascular: Negative for chest pain, palpitations and leg swelling.  Gastrointestinal: Negative for abdominal pain, constipation, diarrhea, nausea and vomiting.  Genitourinary: Positive for dyspareunia and pelvic pain. Negative for dysuria and flank pain.       Right sided pelvic pain  Musculoskeletal: Negative for arthralgias, back pain, gait problem and neck pain.  Skin: Negative for color change.  Allergic/Immunologic: Negative for environmental allergies and food allergies.  Neurological: Negative for dizziness and headaches.  Hematological: Does not bruise/bleed easily.  Psychiatric/Behavioral: Positive for sleep disturbance. Negative for agitation, behavioral problems (depression) and hallucinations.    Vital Signs: BP 140/90   Pulse 87   Temp (!) 97.3 F (36.3 C)   Resp 16   Ht 5\' 7"  (1.702 m)   Wt 127 lb 12.8 oz (58 kg)   SpO2 98%   BMI 20.02 kg/m    Physical Exam Vitals reviewed.  Constitutional:      Appearance: Normal appearance. She is normal weight.  Cardiovascular:     Rate and Rhythm: Normal rate and regular rhythm.     Pulses: Normal pulses.     Heart sounds: Normal heart sounds.  Pulmonary:     Effort: Pulmonary effort is normal.     Breath sounds: Normal breath sounds.  Abdominal:     General: Abdomen is flat.     Palpations: Abdomen is soft.  Musculoskeletal:        General: Normal range of motion.     Cervical back: Normal range of motion.  Skin:    General: Skin is warm.  Neurological:     General: No focal deficit present.     Mental Status: She is alert and oriented to person, place, and time. Mental status is at baseline.  Psychiatric:        Mood and Affect: Mood normal.        Behavior: Behavior normal.        Thought Content: Thought content normal.        Judgment: Judgment normal.    Assessment/Plan: 1. Pelvic pain Review pelvic US and adjust plan as indicated - US Pelvic Complete With Transvaginal;  Future  2. Dyspareunia, female Review pelvic US and adjust plan as indicated - US Pelvic Complete With Transvaginal; Future  3. Insomnia, unspecified type May continue with low dose alprazolam as needed at night for insomnia Consider ONO at next vsiti [place] Controlled Substance Database was reviewed by me for overdose risk score (ORS) - ALPRAZolam (XANAX) 0.25 MG tablet; Take one tab po qhs prn for insomnia  Dispense: 30 tablet; Refill: 2  4. Encounter for long-term (current) use of high-risk medication - POCT Urine Drug Screen  General Counseling: Ilyana verbalizes understanding of the findings of todays visit and agrees with plan of treatment. I have discussed any further diagnostic evaluation that may be needed or ordered today. We also reviewed her medications today. she has been encouraged  to call the office with any questions or concerns that should arise related to todays visit.    Orders Placed This Encounter  Procedures  . US Pelvic Complete With Transvaginal  . POCT Urine Drug Screen    Meds ordered this encounter  Medications  . ALPRAZolam (XANAX) 0.25 MG tablet    Sig: Take one tab po qhs prn for insomnia    Dispense:  30 tablet    Refill:  2    Time spent: 30 Minutes Time spent includes review of chart, medications, test results and follow-up plan with the patient.  This patient was seen by Theodoro Grist AGNP-C in Collaboration with Dr Lavera Guise as a part of collaborative care agreement     Tanna Furry. Clearance Chenault AGNP-C Internal medicine

## 2020-10-14 ENCOUNTER — Encounter: Payer: Self-pay | Admitting: Hospice and Palliative Medicine

## 2020-10-24 ENCOUNTER — Telehealth: Payer: Self-pay

## 2020-10-24 NOTE — Telephone Encounter (Signed)
Advised patient of ultrasound scheduled for 11/12/20 @ 1100 at  [facility] outpatient center. Sarah Clarke

## 2020-11-12 ENCOUNTER — Ambulatory Visit: Payer: BLUE CROSS/BLUE SHIELD

## 2020-11-15 ENCOUNTER — Other Ambulatory Visit: Payer: Self-pay | Admitting: Internal Medicine

## 2020-11-15 DIAGNOSIS — K219 Gastro-esophageal reflux disease without esophagitis: Secondary | ICD-10-CM

## 2020-11-24 ENCOUNTER — Ambulatory Visit: Payer: BLUE CROSS/BLUE SHIELD | Admitting: Internal Medicine

## 2020-12-26 ENCOUNTER — Ambulatory Visit (INDEPENDENT_AMBULATORY_CARE_PROVIDER_SITE_OTHER): Payer: BLUE CROSS/BLUE SHIELD | Admitting: Physician Assistant

## 2020-12-26 ENCOUNTER — Other Ambulatory Visit: Payer: Self-pay

## 2020-12-26 ENCOUNTER — Encounter: Payer: Self-pay | Admitting: Physician Assistant

## 2020-12-26 DIAGNOSIS — G4700 Insomnia, unspecified: Secondary | ICD-10-CM | POA: Diagnosis not present

## 2020-12-26 DIAGNOSIS — K219 Gastro-esophageal reflux disease without esophagitis: Secondary | ICD-10-CM

## 2020-12-26 DIAGNOSIS — R102 Pelvic and perineal pain: Secondary | ICD-10-CM | POA: Diagnosis not present

## 2020-12-26 MED ORDER — ALPRAZOLAM 0.25 MG PO TABS: ORAL_TABLET | ORAL | 2 refills | Status: DC

## 2020-12-26 NOTE — Progress Notes (Signed)
[facility] [place], [place] [address]  Internal MEDICINE  Office Visit Note  Patient Name: Sarah Clarke  [id number]  [id number]  Date of Service: 12/26/2020  Chief Complaint  Patient presents with   Anxiety   Follow-up    HPI Pt is here for routine follow up and med refill. -Pelvic pain had resolved by the time the pelvic US got scheduled/approved so she did not have this done and has not had any recurrence. -Has slept well the past week. Got a massage and it helped.  -Normally wakes up several times per night. -Will take xanax around 9pm before bed to help calm her down to sleep. Does not take it every night.  Discussed trying active thinking exercises as well. 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    pantoprazole (PROTONIX) 40 MG tablet TAKE 1 TABLET BY MOUTH EVERY DAY   [DISCONTINUED] ALPRAZolam (XANAX) 0.25 MG tablet Take one tab po qhs prn for insomnia   ALPRAZolam (XANAX) 0.25 MG tablet Take one tab po qhs prn for insomnia   No facility-administered encounter medications on file as of 12/26/2020.    Surgical History: Past Surgical History:  Procedure Laterality Date   ABDOMINAL HYSTERECTOMY     Partial   BACK SURGERY     COLON SURGERY     COLONOSCOPY WITH PROPOFOL N/A 07/14/2017   Procedure: COLONOSCOPY WITH PROPOFOL;  Surgeon: Jonathon Bellows, MD;  Location: [facility] ENDOSCOPY;  Service: Gastroenterology;  Laterality: N/A;   LAPAROTOMY N/A 12/29/2014   Procedure: EXPLORATORY LAPAROTOMY with right hemicolectomy ;  Surgeon: Marlyce Huge, MD;  Location: [facility] ORS;  Service: General;  Laterality: N/A;   neck fusion      Medical History: Past Medical History:  Diagnosis Date   Anxiety    Arthritis     Family History: Family History  Problem Relation Age  of Onset   Diabetes Mother    Hypertension Father     Social History   Socioeconomic History   Marital status: Divorced    Spouse name: Not on file   Number of children: Not on file   Years of education: Not on file   Highest education level: Not on file  Occupational History   Not on file  Tobacco Use   Smoking status: Never   Smokeless tobacco: Never  Vaping Use   Vaping Use: Never used  Substance and Sexual Activity   Alcohol use: Yes    Comment: social   Drug use: No   Sexual activity: Not Currently  Other Topics Concern   Not on file  Social History Narrative   Not on file   Social Determinants of Health   Financial Resource Strain: Not on file  Food Insecurity: Not on file  Transportation Needs: Not on file  Physical Activity: Not on file  Stress: Not on file  Social Connections: Not on file  Intimate Partner Violence: Not on file      Review of Systems  Constitutional:  Negative for chills, fatigue and unexpected weight change.  HENT:  Negative for congestion, postnasal drip, rhinorrhea, sneezing and sore throat.   Eyes:  Negative for redness.  Respiratory:  Negative for cough, chest tightness and shortness of breath.   Cardiovascular:  Negative for chest pain and palpitations.  Gastrointestinal:  Negative for abdominal pain,  constipation, diarrhea, nausea and vomiting.  Genitourinary:  Negative for dysuria, frequency and pelvic pain.  Musculoskeletal:  Negative for arthralgias, back pain, joint swelling and neck pain.  Skin:  Negative for rash.  Neurological: Negative.  Negative for tremors and numbness.  Hematological:  Negative for adenopathy. Does not bruise/bleed easily.  Psychiatric/Behavioral:  Positive for sleep disturbance. Negative for behavioral problems (Depression) and suicidal ideas. The patient is nervous/anxious.    Vital Signs: BP 140/80   Pulse 83   Temp 97.8 F (36.6 C)   Resp 16   Ht 5\' 7"  (1.702 m)   Wt 124 lb 6.4 oz (56.4  kg)   SpO2 99%   BMI 19.48 kg/m    Physical Exam Vitals and nursing note reviewed.  Constitutional:      General: She is not in acute distress.    Appearance: She is well-developed and normal weight. She is not diaphoretic.  HENT:     Head: Normocephalic and atraumatic.     Mouth/Throat:     Pharynx: No oropharyngeal exudate.  Eyes:     Pupils: Pupils are equal, round, and reactive to light.  Neck:     Thyroid: No thyromegaly.     Vascular: No JVD.     Trachea: No tracheal deviation.  Cardiovascular:     Rate and Rhythm: Normal rate and regular rhythm.     Heart sounds: Normal heart sounds. No murmur heard.   No friction rub. No gallop.  Pulmonary:     Effort: Pulmonary effort is normal. No respiratory distress.     Breath sounds: No wheezing or rales.  Chest:     Chest wall: No tenderness.  Abdominal:     General: Bowel sounds are normal.     Palpations: Abdomen is soft.  Musculoskeletal:        General: Normal range of motion.     Cervical back: Normal range of motion and neck supple.  Lymphadenopathy:     Cervical: No cervical adenopathy.  Skin:    General: Skin is warm and dry.  Neurological:     Mental Status: She is alert and oriented to person, place, and time.     Cranial Nerves: No cranial nerve deficit.  Psychiatric:        Behavior: Behavior normal.        Thought Content: Thought content normal.        Judgment: Judgment normal.       Assessment/Plan: 1. Insomnia, unspecified type May take Xanax as needed at bedtime.  Patient reports she does not use it every night.  Also educated on using active thinking exercises. - ALPRAZolam (XANAX) 0.25 MG tablet; Take one tab po qhs prn for insomnia  Dispense: 30 tablet; Refill: 2  2. Pelvic pain Resolved, patient will let us know if any recurrence  3. Gastroesophageal reflux disease without esophagitis Continue pantoprazole   General Counseling: Lashawn verbalizes understanding of the findings of  todays visit and agrees with plan of treatment. I have discussed any further diagnostic evaluation that may be needed or ordered today. We also reviewed her medications today. she has been encouraged to call the office with any questions or concerns that should arise related to todays visit.    No orders of the defined types were placed in this encounter.   Meds ordered this encounter  Medications   ALPRAZolam (XANAX) 0.25 MG tablet    Sig: Take one tab po qhs prn for insomnia    Dispense:  30 tablet    Refill:  2     This patient was seen by Drema Dallas, PA-C in collaboration with Dr. Clayborn Bigness as a part of collaborative care agreement.   Total time spent:30 Minutes Time spent includes review of chart, medications, test results, and follow up plan with the patient.      Dr Lavera Guise Internal medicine

## 2020-12-26 NOTE — Patient Instructions (Signed)
Insomnia Insomnia is a sleep disorder that makes it difficult to fall asleep or stay asleep. Insomnia can cause fatigue, low energy, difficulty concentrating, moodswings, and poor performance at work or school. There are three different ways to classify insomnia: Difficulty falling asleep. Difficulty staying asleep. Waking up too early in the morning. Any type of insomnia can be long-term (chronic) or short-term (acute). Both are common. Short-term insomnia usually lasts for three months or less. Chronic insomnia occurs at least three times a week for longer than threemonths. What are the causes? Insomnia may be caused by another condition, situation, or substance, such as: Anxiety. Certain medicines. Gastroesophageal reflux disease (GERD) or other gastrointestinal conditions. Asthma or other breathing conditions. Restless legs syndrome, sleep apnea, or other sleep disorders. Chronic pain. Menopause. Stroke. Abuse of alcohol, tobacco, or illegal drugs. Mental health conditions, such as depression. Caffeine. Neurological disorders, such as Alzheimer's disease. An overactive thyroid (hyperthyroidism). Sometimes, the cause of insomnia may not be known. What increases the risk? Risk factors for insomnia include: Gender. Women are affected more often than men. Age. Insomnia is more common as you get older. Stress. Lack of exercise. Irregular work schedule or working night shifts. Traveling between different time zones. Certain medical and mental health conditions. What are the signs or symptoms? If you have insomnia, the main symptom is having trouble falling asleep or having trouble staying asleep. This may lead to other symptoms, such as: Feeling fatigued or having low energy. Feeling nervous about going to sleep. Not feeling rested in the morning. Having trouble concentrating. Feeling irritable, anxious, or depressed. How is this diagnosed? This condition may be diagnosed based  on: Your symptoms and medical history. Your health care provider may ask about: Your sleep habits. Any medical conditions you have. Your mental health. A physical exam. How is this treated? Treatment for insomnia depends on the cause. Treatment may focus on treating an underlying condition that is causing insomnia. Treatment may also include: Medicines to help you sleep. Counseling or therapy. Lifestyle adjustments to help you sleep better. Follow these instructions at home: Eating and drinking  Limit or avoid alcohol, caffeinated beverages, and cigarettes, especially close to bedtime. These can disrupt your sleep. Do not eat a large meal or eat spicy foods right before bedtime. This can lead to digestive discomfort that can make it hard for you to sleep.  Sleep habits  Keep a sleep diary to help you and your health care provider figure out what could be causing your insomnia. Write down: When you sleep. When you wake up during the night. How well you sleep. How rested you feel the next day. Any side effects of medicines you are taking. What you eat and drink. Make your bedroom a dark, comfortable place where it is easy to fall asleep. Put up shades or blackout curtains to block light from outside. Use a white noise machine to block noise. Keep the temperature cool. Limit screen use before bedtime. This includes: Watching TV. Using your smartphone, tablet, or computer. Stick to a routine that includes going to bed and waking up at the same times every day and night. This can help you fall asleep faster. Consider making a quiet activity, such as reading, part of your nighttime routine. Try to avoid taking naps during the day so that you sleep better at night. Get out of bed if you are still awake after 15 minutes of trying to sleep. Keep the lights down, but try reading or doing a quiet   activity. When you feel sleepy, go back to bed.  General instructions Take over-the-counter  and prescription medicines only as told by your health care provider. Exercise regularly, as told by your health care provider. Avoid exercise starting several hours before bedtime. Use relaxation techniques to manage stress. Ask your health care provider to suggest some techniques that may work well for you. These may include: Breathing exercises. Routines to release muscle tension. Visualizing peaceful scenes. Make sure that you drive carefully. Avoid driving if you feel very sleepy. Keep all follow-up visits as told by your health care provider. This is important. Contact a health care provider if: You are tired throughout the day. You have trouble in your daily routine due to sleepiness. You continue to have sleep problems, or your sleep problems get worse. Get help right away if: You have serious thoughts about hurting yourself or someone else. If you ever feel like you may hurt yourself or others, or have thoughts about taking your own life, get help right away. You can go to your nearest emergency department or call: Your local emergency services (911 in the [place] A suicide crisis helpline, such as the National Suicide Prevention Lifeline at 1-800-273-8255. This is open 24 hours a day. Summary Insomnia is a sleep disorder that makes it difficult to fall asleep or stay asleep. Insomnia can be long-term (chronic) or short-term (acute). Treatment for insomnia depends on the cause. Treatment may focus on treating an underlying condition that is causing insomnia. Keep a sleep diary to help you and your health care provider figure out what could be causing your insomnia. This information is not intended to replace advice given to you by your health care provider. Make sure you discuss any questions you have with your healthcare provider. Document Revised: 05/15/2020 Document Reviewed: 05/15/2020 [employer] Patient Education  2022 [employer]  

## 2021-01-12 ENCOUNTER — Ambulatory Visit: Payer: BLUE CROSS/BLUE SHIELD | Admitting: Physician Assistant

## 2021-03-30 ENCOUNTER — Encounter: Payer: Self-pay | Admitting: Physician Assistant

## 2021-03-30 ENCOUNTER — Ambulatory Visit: Payer: BLUE CROSS/BLUE SHIELD | Admitting: Physician Assistant

## 2021-03-30 ENCOUNTER — Other Ambulatory Visit: Payer: Self-pay

## 2021-03-30 VITALS — BP 142/88 | HR 75 | Temp 98.5°F | Resp 16 | Ht 67.0 in | Wt 128.2 lb

## 2021-03-30 DIAGNOSIS — G4700 Insomnia, unspecified: Secondary | ICD-10-CM

## 2021-03-30 DIAGNOSIS — E559 Vitamin D deficiency, unspecified: Secondary | ICD-10-CM | POA: Diagnosis not present

## 2021-03-30 DIAGNOSIS — R5383 Other fatigue: Secondary | ICD-10-CM

## 2021-03-30 DIAGNOSIS — E782 Mixed hyperlipidemia: Secondary | ICD-10-CM

## 2021-03-30 DIAGNOSIS — N941 Unspecified dyspareunia: Secondary | ICD-10-CM

## 2021-03-30 MED ORDER — PREMARIN 0.625 MG/GM VA CREA: 1.0000 | TOPICAL_CREAM | VAGINAL | 2 refills | Status: AC

## 2021-03-30 MED ORDER — TRAZODONE HCL 50 MG PO TABS: 50.0000 mg | ORAL_TABLET | Freq: Every day | ORAL | 1 refills | Status: DC

## 2021-03-30 MED ORDER — ALPRAZOLAM 0.25 MG PO TABS
ORAL_TABLET | ORAL | 2 refills | Status: DC
Start: 2021-03-30 — End: 2021-07-22

## 2021-03-30 NOTE — Progress Notes (Signed)
[facility] [place], [place] [address]  Internal MEDICINE  Office Visit Note  Patient Name: Sarah Clarke  [id number]  [id number]  Date of Service: 04/01/2021  Chief Complaint  Patient presents with   Follow-up    Refills, trouble staying asleep   Anxiety    HPI Pt is here for routine follow up -Sleep is about the same, can sometimes have difficult falling back asleep if she wakes up early -Takes xanax as needed if she cant get to sleep by 11 or so. If tired she doesn't take anything. Would be ok with trying alternative medication to help her sleep. -Also mentions she has been experiencing pain with sex, she has tried lubricant, but still painful and would be interested in medication to help. She has had a hysterectomy, -Due for routine fasting labs prior to next visit  Current Medication: Outpatient Encounter Medications as of 03/30/2021  Medication Sig   conjugated estrogens (PREMARIN) vaginal cream Place 1 Applicatorful vaginally once a week.   ibuprofen (ADVIL,MOTRIN) 600 MG tablet Take 600 mg by mouth as needed.    pantoprazole (PROTONIX) 40 MG tablet TAKE 1 TABLET BY MOUTH EVERY DAY   traZODone (DESYREL) 50 MG tablet Take 1 tablet (50 mg total) by mouth at bedtime.   [DISCONTINUED] ALPRAZolam (XANAX) 0.25 MG tablet Take one tab po qhs prn for insomnia   ALPRAZolam (XANAX) 0.25 MG tablet Take one tab po qhs prn for insomnia   No facility-administered encounter medications on file as of 03/30/2021.    Surgical History: Past Surgical History:  Procedure Laterality Date   ABDOMINAL HYSTERECTOMY     Partial   BACK SURGERY     COLON SURGERY     COLONOSCOPY WITH PROPOFOL N/A 07/14/2017   Procedure: COLONOSCOPY WITH PROPOFOL;  Surgeon: Jonathon Bellows, MD;  Location: [facility] ENDOSCOPY;  Service: Gastroenterology;  Laterality: N/A;   LAPAROTOMY N/A 12/29/2014   Procedure: EXPLORATORY LAPAROTOMY with right hemicolectomy ;  Surgeon: Marlyce Huge,  MD;  Location: [facility] ORS;  Service: General;  Laterality: N/A;   neck fusion      Medical History: Past Medical History:  Diagnosis Date   Anxiety    Arthritis     Family History: Family History  Problem Relation Age of Onset   Diabetes Mother    Hypertension Father     Social History   Socioeconomic History   Marital status: Divorced    Spouse name: Not on file   Number of children: Not on file   Years of education: Not on file   Highest education level: Not on file  Occupational History   Not on file  Tobacco Use   Smoking status: Never   Smokeless tobacco: Never  Vaping Use   Vaping Use: Never used  Substance and Sexual Activity   Alcohol use: Yes    Comment: social   Drug use: No   Sexual activity: Not Currently  Other Topics Concern   Not on file  Social History Narrative   Not on file   Social Determinants of Health   Financial Resource Strain: Not on file  Food Insecurity: Not on file  Transportation Needs: Not on file  Physical Activity: Not on file  Stress: Not on file  Social Connections: Not on file  Intimate Partner Violence: Not on file      Review of Systems  Constitutional:  Negative for chills, fatigue and unexpected weight change.  HENT:  Negative for congestion, postnasal drip, rhinorrhea,  sneezing and sore throat.   Eyes:  Negative for redness.  Respiratory:  Negative for cough, chest tightness and shortness of breath.   Cardiovascular:  Negative for chest pain and palpitations.  Gastrointestinal:  Negative for abdominal pain, constipation, diarrhea, nausea and vomiting.  Genitourinary:  Positive for dyspareunia. Negative for dysuria, frequency and pelvic pain.  Musculoskeletal:  Negative for arthralgias, back pain, joint swelling and neck pain.  Skin:  Negative for rash.  Neurological: Negative.  Negative for tremors and numbness.  Hematological:  Negative for adenopathy. Does not bruise/bleed easily.  Psychiatric/Behavioral:   Positive for sleep disturbance. Negative for behavioral problems (Depression) and suicidal ideas. The patient is nervous/anxious.    Vital Signs: BP (!) 142/88   Pulse 75   Temp 98.5 F (36.9 C)   Resp 16   Ht '5\' 7"'$  (1.702 m)   Wt 128 lb 3.2 oz (58.2 kg)   SpO2 99%   BMI 20.08 kg/m    Physical Exam Vitals and nursing note reviewed.  Constitutional:      General: She is not in acute distress.    Appearance: She is well-developed and normal weight. She is not diaphoretic.  HENT:     Head: Normocephalic and atraumatic.     Mouth/Throat:     Pharynx: No oropharyngeal exudate.  Eyes:     Pupils: Pupils are equal, round, and reactive to light.  Neck:     Thyroid: No thyromegaly.     Vascular: No JVD.     Trachea: No tracheal deviation.  Cardiovascular:     Rate and Rhythm: Normal rate and regular rhythm.     Heart sounds: Normal heart sounds. No murmur heard.   No friction rub. No gallop.  Pulmonary:     Effort: Pulmonary effort is normal. No respiratory distress.     Breath sounds: No wheezing or rales.  Chest:     Chest wall: No tenderness.  Abdominal:     General: Bowel sounds are normal.     Palpations: Abdomen is soft.  Musculoskeletal:        General: Normal range of motion.     Cervical back: Normal range of motion and neck supple.  Lymphadenopathy:     Cervical: No cervical adenopathy.  Skin:    General: Skin is warm and dry.  Neurological:     Mental Status: She is alert and oriented to person, place, and time.     Cranial Nerves: No cranial nerve deficit.  Psychiatric:        Behavior: Behavior normal.        Thought Content: Thought content normal.        Judgment: Judgment normal.       Assessment/Plan: 1. Insomnia, unspecified type Will try trazodone 1/2-1 tablet before bed instead of xanax, but may still use xanax if needed. - traZODone (DESYREL) 50 MG tablet; Take 1 tablet (50 mg total) by mouth at bedtime.  Dispense: 30 tablet; Refill: 1 -  ALPRAZolam (XANAX) 0.25 MG tablet; Take one tab po qhs prn for insomnia  Dispense: 30 tablet; Refill: 2  2. Dyspareunia in female Will start premarin cream 1-2 times per week, instructions given on how to use and that full applicator full not needed. - conjugated estrogens (PREMARIN) vaginal cream; Place 1 Applicatorful vaginally once a week.  Dispense: 42.5 g; Refill: 2  3. Other fatigue - CBC w/Diff/Platelet - Comprehensive metabolic panel - 99991111 - Lipid Panel With LDL/HDL Ratio - VITAMIN D 25 Hydroxy (  Vit-D Deficiency, Fractures)  4. Vit D deficiency  5. Mixed hyperlipidemia  General Counseling: Tangela verbalizes understanding of the findings of todays visit and agrees with plan of treatment. I have discussed any further diagnostic evaluation that may be needed or ordered today. We also reviewed her medications today. she has been encouraged to call the office with any questions or concerns that should arise related to todays visit.    Orders Placed This Encounter  Procedures   CBC w/Diff/Platelet   Comprehensive metabolic panel   99991111   Lipid Panel With LDL/HDL Ratio   VITAMIN D 25 Hydroxy (Vit-D Deficiency, Fractures)     Meds ordered this encounter  Medications   conjugated estrogens (PREMARIN) vaginal cream    Sig: Place 1 Applicatorful vaginally once a week.    Dispense:  42.5 g    Refill:  2   traZODone (DESYREL) 50 MG tablet    Sig: Take 1 tablet (50 mg total) by mouth at bedtime.    Dispense:  30 tablet    Refill:  1   ALPRAZolam (XANAX) 0.25 MG tablet    Sig: Take one tab po qhs prn for insomnia    Dispense:  30 tablet    Refill:  2     This patient was seen by Drema Dallas, PA-C in collaboration with Dr. Clayborn Bigness as a part of collaborative care agreement.   Total time spent:30 Minutes Time spent includes review of chart, medications, test results, and follow up plan with the patient.      Dr Lavera Guise Internal  medicine

## 2021-04-22 ENCOUNTER — Other Ambulatory Visit: Payer: Self-pay | Admitting: Physician Assistant

## 2021-04-22 DIAGNOSIS — G4700 Insomnia, unspecified: Secondary | ICD-10-CM

## 2021-06-18 ENCOUNTER — Telehealth: Payer: Self-pay
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
## 2021-06-25 LAB — COMPREHENSIVE METABOLIC PANEL
ALT: 28 IU/L (ref 0–32)
AST: 22 IU/L (ref 0–40)
Albumin/Globulin Ratio: 2.2 (ref 1.2–2.2)
Albumin: 4.7 g/dL (ref 3.8–4.8)
Alkaline Phosphatase: 77 IU/L (ref 44–121)
BUN/Creatinine Ratio: 9 — ABNORMAL LOW (ref 12–28)
BUN: 7 mg/dL — ABNORMAL LOW (ref 8–27)
Bilirubin Total: 0.4 mg/dL (ref 0.0–1.2)
CO2: 27 mmol/L (ref 20–29)
Calcium: 9.9 mg/dL (ref 8.7–10.3)
Chloride: 96 mmol/L (ref 96–106)
Creatinine, Ser: 0.75 mg/dL (ref 0.57–1.00)
Globulin, Total: 2.1 g/dL (ref 1.5–4.5)
Glucose: 93 mg/dL (ref 70–99)
Potassium: 4.5 mmol/L (ref 3.5–5.2)
Sodium: 134 mmol/L (ref 134–144)
Total Protein: 6.8 g/dL (ref 6.0–8.5)
eGFR: 91 mL/min/{1.73_m2} (ref >59)

## 2021-06-25 LAB — CBC WITH DIFFERENTIAL/PLATELET
Basophils Absolute: 0.1 10*3/uL (ref 0.0–0.2)
Basos: 1 %
EOS (ABSOLUTE): 0.2 10*3/uL (ref 0.0–0.4)
Eos: 5 %
Hematocrit: 37.6 % (ref 34.0–46.6)
Hemoglobin: 13 g/dL (ref 11.1–15.9)
Immature Grans (Abs): 0 10*3/uL (ref 0.0–0.1)
Immature Granulocytes: 0 %
Lymphocytes Absolute: 1.1 10*3/uL (ref 0.7–3.1)
Lymphs: 33 %
MCH: 31.4 pg (ref 26.6–33.0)
MCHC: 34.6 g/dL (ref 31.5–35.7)
MCV: 91 fL (ref 79–97)
Monocytes Absolute: 0.5 10*3/uL (ref 0.1–0.9)
Monocytes: 15 %
Neutrophils Absolute: 1.6 10*3/uL (ref 1.4–7.0)
Neutrophils: 46 %
Platelets: 436 10*3/uL (ref 150–450)
RBC: 4.14 x10E6/uL (ref 3.77–5.28)
RDW: 11.6 % — ABNORMAL LOW (ref 11.7–15.4)
WBC: 3.5 10*3/uL (ref 3.4–10.8)

## 2021-06-25 LAB — LIPID PANEL WITH LDL/HDL RATIO
Cholesterol, Total: 214 mg/dL — ABNORMAL HIGH (ref 100–199)
HDL: 81 mg/dL (ref >39)
LDL Chol Calc (NIH): 113 mg/dL — ABNORMAL HIGH (ref 0–99)
LDL/HDL Ratio: 1.4 ratio (ref 0.0–3.2)
Triglycerides: 113 mg/dL (ref 0–149)
VLDL Cholesterol Cal: 20 mg/dL (ref 5–40)

## 2021-06-25 LAB — TSH+T4F+T3FREE
Free T4: 1.24 ng/dL (ref 0.82–1.77)
T3, Free: 2.8 pg/mL (ref 2.0–4.4)
TSH: 0.974 u[IU]/mL (ref 0.450–4.500)

## 2021-06-25 LAB — VITAMIN D 25 HYDROXY (VIT D DEFICIENCY, FRACTURES): Vit D, 25-Hydroxy: 72.9 ng/mL (ref 30.0–100.0)

## 2021-06-26 ENCOUNTER — Ambulatory Visit (INDEPENDENT_AMBULATORY_CARE_PROVIDER_SITE_OTHER): Payer: BLUE CROSS/BLUE SHIELD | Admitting: Physician Assistant

## 2021-06-26 ENCOUNTER — Encounter: Payer: Self-pay | Admitting: Nurse Practitioner

## 2021-06-26 ENCOUNTER — Other Ambulatory Visit: Payer: Self-pay

## 2021-06-26 VITALS — BP 170/106 | HR 82 | Temp 98.5°F | Resp 16 | Ht 67.0 in | Wt 121.4 lb

## 2021-06-26 DIAGNOSIS — S2222XD Fracture of body of sternum, subsequent encounter for fracture with routine healing: Secondary | ICD-10-CM

## 2021-06-26 DIAGNOSIS — G4700 Insomnia, unspecified: Secondary | ICD-10-CM

## 2021-06-26 DIAGNOSIS — R030 Elevated blood-pressure reading, without diagnosis of hypertension: Secondary | ICD-10-CM

## 2021-06-26 MED ORDER — ONDANSETRON HCL 4 MG PO TABS: 4.0000 mg | ORAL_TABLET | Freq: Three times a day (TID) | ORAL | 0 refills | Status: DC | PRN

## 2021-06-26 MED ORDER — OXYCODONE HCL 5 MG PO CAPS: 5.0000 mg | ORAL_CAPSULE | Freq: Four times a day (QID) | ORAL | 0 refills | Status: DC | PRN

## 2021-06-26 MED ORDER — METHOCARBAMOL 500 MG PO TABS: 500.0000 mg | ORAL_TABLET | Freq: Three times a day (TID) | ORAL | 1 refills | Status: AC | PRN
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
## 2021-07-02 ENCOUNTER — Encounter: Payer: BLUE CROSS/BLUE SHIELD | Admitting: Physician Assistant

## 2021-07-09 ENCOUNTER — Telehealth: Payer: Self-pay

## 2021-07-09 ENCOUNTER — Other Ambulatory Visit: Payer: Self-pay | Admitting: Internal Medicine

## 2021-07-09 DIAGNOSIS — K219 Gastro-esophageal reflux disease without esophagitis: Secondary | ICD-10-CM

## 2021-07-09 NOTE — Telephone Encounter (Signed)
Pt called asking if we can add on lidocaine patch, but she is already taking pain medications prescribed by trauma. Advised we cannot send additional pain medication and she will f/u with trauma at appt on Tuesday

## 2021-07-16 ENCOUNTER — Other Ambulatory Visit: Payer: Self-pay | Admitting: Physician Assistant

## 2021-07-16 DIAGNOSIS — S2222XD Fracture of body of sternum, subsequent encounter for fracture with routine healing: Secondary | ICD-10-CM

## 2021-07-21 ENCOUNTER — Other Ambulatory Visit: Payer: Self-pay | Admitting: Physician Assistant

## 2021-07-21 DIAGNOSIS — G4700 Insomnia, unspecified: Secondary | ICD-10-CM

## 2021-07-30 ENCOUNTER — Telehealth: Payer: Self-pay

## 2021-07-30 NOTE — Telephone Encounter (Signed)
Completed medical records for [place] request of $9.00 faxed to [phone], phone number [phone]

## 2021-08-17 ENCOUNTER — Other Ambulatory Visit: Payer: Self-pay

## 2021-08-17 ENCOUNTER — Encounter: Payer: Self-pay | Admitting: Physician Assistant

## 2021-08-17 ENCOUNTER — Ambulatory Visit (INDEPENDENT_AMBULATORY_CARE_PROVIDER_SITE_OTHER): Payer: 59 | Admitting: Physician Assistant

## 2021-08-17 DIAGNOSIS — R300 Dysuria: Secondary | ICD-10-CM | POA: Diagnosis not present

## 2021-08-17 DIAGNOSIS — E7800 Pure hypercholesterolemia, unspecified: Secondary | ICD-10-CM | POA: Diagnosis not present

## 2021-08-17 DIAGNOSIS — G4700 Insomnia, unspecified: Secondary | ICD-10-CM

## 2021-08-17 DIAGNOSIS — Z0001 Encounter for general adult medical examination with abnormal findings: Secondary | ICD-10-CM

## 2021-08-17 DIAGNOSIS — R0989 Other specified symptoms and signs involving the circulatory and respiratory systems: Secondary | ICD-10-CM

## 2021-08-17 DIAGNOSIS — Z01419 Encounter for gynecological examination (general) (routine) without abnormal findings: Secondary | ICD-10-CM

## 2021-08-17 MED ORDER — TRAMADOL HCL 50 MG PO TABS: 50.0000 mg | ORAL_TABLET | Freq: Two times a day (BID) | ORAL | 0 refills | Status: AC | PRN
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
## 2021-08-20 LAB — URINE CULTURE, REFLEX

## 2021-08-20 LAB — MICROSCOPIC EXAMINATION
Casts: NONE SEEN /lpf
Epithelial Cells (non renal): >10 /hpf — AB (ref 0–10)
RBC, Urine: NONE SEEN /hpf (ref 0–2)

## 2021-08-20 LAB — UA/M W/RFLX CULTURE, ROUTINE
Bilirubin, UA: NEGATIVE
Glucose, UA: NEGATIVE
Nitrite, UA: NEGATIVE
RBC, UA: NEGATIVE
Specific Gravity, UA: 1.024 (ref 1.005–1.030)
Urobilinogen, Ur: 1 mg/dL (ref 0.2–1.0)
pH, UA: 5 (ref 5.0–7.5)

## 2021-08-26 ENCOUNTER — Other Ambulatory Visit: Payer: 59

## 2021-09-03 ENCOUNTER — Ambulatory Visit: Payer: 59 | Admitting: Physician Assistant

## 2021-09-09 ENCOUNTER — Other Ambulatory Visit: Payer: Self-pay

## 2021-09-09 ENCOUNTER — Ambulatory Visit: Payer: 59

## 2021-09-09 DIAGNOSIS — R0989 Other specified symptoms and signs involving the circulatory and respiratory systems: Secondary | ICD-10-CM | POA: Diagnosis not present

## 2021-09-09 DIAGNOSIS — E7800 Pure hypercholesterolemia, unspecified: Secondary | ICD-10-CM

## 2021-09-14 ENCOUNTER — Encounter: Payer: Self-pay | Admitting: Physician Assistant

## 2021-09-14 ENCOUNTER — Other Ambulatory Visit: Payer: Self-pay

## 2021-09-14 ENCOUNTER — Ambulatory Visit (INDEPENDENT_AMBULATORY_CARE_PROVIDER_SITE_OTHER): Payer: 59 | Admitting: Physician Assistant

## 2021-09-14 DIAGNOSIS — Z1231 Encounter for screening mammogram for malignant neoplasm of breast: Secondary | ICD-10-CM

## 2021-09-14 DIAGNOSIS — J302 Other seasonal allergic rhinitis: Secondary | ICD-10-CM | POA: Diagnosis not present

## 2021-09-14 DIAGNOSIS — E7800 Pure hypercholesterolemia, unspecified: Secondary | ICD-10-CM | POA: Diagnosis not present

## 2021-09-14 DIAGNOSIS — G4700 Insomnia, unspecified: Secondary | ICD-10-CM | POA: Diagnosis not present

## 2021-09-14 MED ORDER — CETIRIZINE HCL 10 MG PO TABS: 10.0000 mg | ORAL_TABLET | Freq: Every day | ORAL | 11 refills | Status: AC
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
## 2021-09-18 ENCOUNTER — Other Ambulatory Visit: Payer: Self-pay | Admitting: Physician Assistant

## 2021-09-18 DIAGNOSIS — G4700 Insomnia, unspecified: Secondary | ICD-10-CM

## 2021-10-21 ENCOUNTER — Encounter: Payer: Self-pay | Admitting: Physician Assistant

## 2021-10-25 ENCOUNTER — Other Ambulatory Visit: Payer: Self-pay | Admitting: Physician Assistant

## 2021-10-25 DIAGNOSIS — G4700 Insomnia, unspecified: Secondary | ICD-10-CM

## 2021-12-11 ENCOUNTER — Ambulatory Visit (INDEPENDENT_AMBULATORY_CARE_PROVIDER_SITE_OTHER): Payer: 59 | Admitting: Physician Assistant

## 2021-12-11 ENCOUNTER — Encounter: Payer: Self-pay | Admitting: Physician Assistant

## 2021-12-11 VITALS — BP 134/84 | HR 77 | Temp 98.2°F | Resp 16 | Ht 67.0 in | Wt 123.6 lb

## 2021-12-11 DIAGNOSIS — J302 Other seasonal allergic rhinitis: Secondary | ICD-10-CM | POA: Diagnosis not present

## 2021-12-11 DIAGNOSIS — G4700 Insomnia, unspecified: Secondary | ICD-10-CM

## 2021-12-11 DIAGNOSIS — E538 Deficiency of other specified B group vitamins: Secondary | ICD-10-CM | POA: Diagnosis not present

## 2021-12-11 MED ORDER — ALPRAZOLAM 0.25 MG PO TABS: ORAL_TABLET | ORAL | 1 refills | Status: DC
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
## 2021-12-12 LAB — B12 AND FOLATE PANEL
Folate: 10.5 ng/mL (ref >3.0)
Vitamin B-12: >2000 pg/mL — ABNORMAL HIGH (ref 232–1245)

## 2021-12-15 ENCOUNTER — Telehealth: Payer: Self-pay

## 2021-12-15 NOTE — Telephone Encounter (Signed)
-----   Message from Mylinda Latina, PA-C sent at 12/15/2021 12:02 PM EDT ----- Please let her know that her b12 was actually elevated therefore she would not need b12 injections

## 2021-12-15 NOTE — Telephone Encounter (Signed)
Called and informed pt that her B12 was elevated and she does not need B12 injections.  Pt understood.

## 2021-12-15 NOTE — Telephone Encounter (Addendum)
-----   Message from Mylinda Latina, PA-C sent at 12/15/2021 12:02 PM EDT ----- Please let her know that her b12 was actually elevated therefore she would not need b12 injections

## 2021-12-25 ENCOUNTER — Other Ambulatory Visit: Payer: Self-pay | Admitting: Physician Assistant

## 2021-12-25 DIAGNOSIS — K219 Gastro-esophageal reflux disease without esophagitis: Secondary | ICD-10-CM

## 2022-01-28 ENCOUNTER — Telehealth: Payer: Self-pay

## 2022-01-28 NOTE — Telephone Encounter (Signed)
Medical record request completed and requesting records mailed to [facility] [address] [place] [address]. Faxed payment request to [phone].

## 2022-02-02 ENCOUNTER — Telehealth: Payer: Self-pay

## 2022-02-02 ENCOUNTER — Other Ambulatory Visit: Payer: Self-pay

## 2022-02-02 MED ORDER — CELECOXIB 200 MG PO CAPS: 200.0000 mg | ORAL_CAPSULE | Freq: Two times a day (BID) | ORAL | 0 refills | Status: DC

## 2022-02-02 NOTE — Telephone Encounter (Signed)
Sent med to pharmacy. Per pt she is taking celebrex 200 mg twice a day and her pharmacy is [facility] on [address]. Pt says that Heather prescribed the medication to her.

## 2022-02-02 NOTE — Telephone Encounter (Signed)
LMOM to verify the dose of the celebrex pt is taking and her pharmacy

## 2022-03-02 ENCOUNTER — Other Ambulatory Visit: Payer: Self-pay | Admitting: Physician Assistant

## 2022-03-12 ENCOUNTER — Encounter: Payer: Self-pay | Admitting: Physician Assistant

## 2022-03-12 ENCOUNTER — Ambulatory Visit (INDEPENDENT_AMBULATORY_CARE_PROVIDER_SITE_OTHER): Payer: 59 | Admitting: Physician Assistant

## 2022-03-12 DIAGNOSIS — G4700 Insomnia, unspecified: Secondary | ICD-10-CM

## 2022-03-12 MED ORDER — ALPRAZOLAM 0.25 MG PO TABS: ORAL_TABLET | ORAL | 2 refills | Status: AC
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
## 2022-03-23 ENCOUNTER — Telehealth: Payer: Self-pay

## 2022-03-24 ENCOUNTER — Other Ambulatory Visit: Payer: Self-pay

## 2022-03-24 MED ORDER — FLUCONAZOLE 150 MG PO TABS: ORAL_TABLET | ORAL | 0 refills | Status: AC

## 2022-03-24 NOTE — Telephone Encounter (Signed)
Sent diflucan 150 mg to pharmacy and informed pt

## 2022-04-12 ENCOUNTER — Telehealth: Payer: Self-pay

## 2022-04-12 NOTE — Telephone Encounter (Signed)
error 

## 2022-04-21 ENCOUNTER — Other Ambulatory Visit: Payer: Self-pay | Admitting: Family Medicine

## 2022-04-21 DIAGNOSIS — Z1231 Encounter for screening mammogram for malignant neoplasm of breast: Secondary | ICD-10-CM

## 2022-04-22 ENCOUNTER — Ambulatory Visit
Admission: RE | Admit: 2022-04-22 | Discharge: 2022-04-22 | Disposition: A | Discharge status: Home / Self Care | Payer: 59 | Source: Ambulatory Visit | Attending: Family Medicine | Admitting: Family Medicine

## 2022-04-22 DIAGNOSIS — Z1231 Encounter for screening mammogram for malignant neoplasm of breast: Secondary | ICD-10-CM | POA: Diagnosis present

## 2022-04-23 ENCOUNTER — Other Ambulatory Visit: Payer: Self-pay | Admitting: Family Medicine

## 2022-04-23 DIAGNOSIS — R928 Other abnormal and inconclusive findings on diagnostic imaging of breast: Secondary | ICD-10-CM

## 2022-04-23 DIAGNOSIS — N6489 Other specified disorders of breast: Secondary | ICD-10-CM

## 2022-04-28 ENCOUNTER — Inpatient Hospital Stay
Admission: RE | Admit: 2022-04-28 | Discharge: 2022-04-28 | Disposition: A | Discharge status: Home / Self Care | Payer: Self-pay | Source: Ambulatory Visit | Attending: *Deleted | Admitting: *Deleted

## 2022-04-28 ENCOUNTER — Other Ambulatory Visit: Payer: Self-pay | Admitting: *Deleted

## 2022-04-28 DIAGNOSIS — Z1231 Encounter for screening mammogram for malignant neoplasm of breast: Secondary | ICD-10-CM

## 2022-05-11 ENCOUNTER — Ambulatory Visit
Admission: RE | Admit: 2022-05-11 | Discharge: 2022-05-11 | Disposition: A | Discharge status: Home / Self Care | Payer: 59 | Source: Ambulatory Visit | Attending: Family Medicine | Admitting: Family Medicine

## 2022-05-11 DIAGNOSIS — N6489 Other specified disorders of breast: Secondary | ICD-10-CM | POA: Insufficient documentation

## 2022-05-11 DIAGNOSIS — R928 Other abnormal and inconclusive findings on diagnostic imaging of breast: Secondary | ICD-10-CM | POA: Insufficient documentation

## 2022-06-06 ENCOUNTER — Telehealth: Payer: Self-pay | Admitting: Internal Medicine

## 2022-06-06 NOTE — Telephone Encounter (Signed)
Cancelled future appointments. Patient has been going to [facility] primary since [place]

## 2022-06-07 ENCOUNTER — Ambulatory Visit: Payer: Self-pay | Admitting: Physician Assistant

## 2022-06-16 ENCOUNTER — Other Ambulatory Visit: Payer: Self-pay | Admitting: Physician Assistant

## 2022-06-16 DIAGNOSIS — G4700 Insomnia, unspecified: Secondary | ICD-10-CM

## 2022-06-27 ENCOUNTER — Other Ambulatory Visit: Payer: Self-pay | Admitting: Physician Assistant

## 2022-06-27 DIAGNOSIS — K219 Gastro-esophageal reflux disease without esophagitis: Secondary | ICD-10-CM

## 2022-06-27 NOTE — Telephone Encounter (Signed)
Declined,  I think she changed pcp

## 2022-08-02 ENCOUNTER — Encounter: Payer: Self-pay | Admitting: Physician Assistant

## 2022-08-20 ENCOUNTER — Encounter: Payer: Self-pay | Admitting: Physician Assistant

## 2022-09-15 ENCOUNTER — Other Ambulatory Visit: Payer: Self-pay | Admitting: Physician Assistant

## 2022-09-15 DIAGNOSIS — J302 Other seasonal allergic rhinitis: Secondary | ICD-10-CM

## 2022-09-30 ENCOUNTER — Other Ambulatory Visit: Payer: Self-pay | Admitting: Physician Assistant

## 2022-09-30 DIAGNOSIS — J302 Other seasonal allergic rhinitis: Secondary | ICD-10-CM

## 2022-10-04 ENCOUNTER — Emergency Department
Admission: EM | Admit: 2022-10-04 | Discharge: 2022-10-04 | Disposition: A | Discharge status: Home / Self Care | Payer: 59 | Attending: Emergency Medicine | Admitting: Emergency Medicine

## 2022-10-04 ENCOUNTER — Emergency Department: Payer: 59

## 2022-10-04 ENCOUNTER — Other Ambulatory Visit: Payer: Self-pay

## 2022-10-04 DIAGNOSIS — R55 Syncope and collapse: Secondary | ICD-10-CM | POA: Diagnosis not present

## 2022-10-04 DIAGNOSIS — E871 Hypo-osmolality and hyponatremia: Secondary | ICD-10-CM | POA: Insufficient documentation

## 2022-10-04 DIAGNOSIS — E860 Dehydration: Secondary | ICD-10-CM | POA: Diagnosis not present

## 2022-10-04 LAB — URINALYSIS, ROUTINE W REFLEX MICROSCOPIC
Bacteria, UA: NONE SEEN
Bilirubin Urine: NEGATIVE
Glucose, UA: NEGATIVE mg/dL
Hgb urine dipstick: NEGATIVE
Ketones, ur: NEGATIVE mg/dL
Leukocytes,Ua: NEGATIVE
Nitrite: NEGATIVE
Protein, ur: 30 mg/dL — AB
Specific Gravity, Urine: 1.015 (ref 1.005–1.030)
pH: 6 (ref 5.0–8.0)

## 2022-10-04 LAB — D-DIMER, QUANTITATIVE: D-Dimer, Quant: 0.41 ug/mL-FEU (ref 0.00–0.50)

## 2022-10-04 LAB — BASIC METABOLIC PANEL
Anion gap: 15 (ref 5–15)
BUN: 12 mg/dL (ref 8–23)
CO2: 20 mmol/L — ABNORMAL LOW (ref 22–32)
Calcium: 9.2 mg/dL (ref 8.9–10.3)
Chloride: 95 mmol/L — ABNORMAL LOW (ref 98–111)
Creatinine, Ser: 0.77 mg/dL (ref 0.44–1.00)
GFR, Estimated: >60 mL/min (ref >60)
Glucose, Bld: 125 mg/dL — ABNORMAL HIGH (ref 70–99)
Potassium: 3.5 mmol/L (ref 3.5–5.1)
Sodium: 130 mmol/L — ABNORMAL LOW (ref 135–145)

## 2022-10-04 LAB — CBC
HCT: 37.1 % (ref 36.0–46.0)
Hemoglobin: 12.9 g/dL (ref 12.0–15.0)
MCH: 31.8 pg (ref 26.0–34.0)
MCHC: 34.8 g/dL (ref 30.0–36.0)
MCV: 91.4 fL (ref 80.0–100.0)
Platelets: 341 10*3/uL (ref 150–400)
RBC: 4.06 MIL/uL (ref 3.87–5.11)
RDW: 11.3 % — ABNORMAL LOW (ref 11.5–15.5)
WBC: 9.2 10*3/uL (ref 4.0–10.5)
nRBC: 0 % (ref 0.0–0.2)

## 2022-10-04 LAB — TROPONIN I (HIGH SENSITIVITY)
Troponin I (High Sensitivity): <2 ng/L (ref <18)
Troponin I (High Sensitivity): 9 ng/L (ref <18)

## 2022-10-04 MED ORDER — SODIUM CHLORIDE 0.9 % IV BOLUS
500.0000 mL | Freq: Once | INTRAVENOUS | Status: AC
  Administered 2022-10-04: 500 mL via INTRAVENOUS

## 2022-10-04 MED ORDER — ALPRAZOLAM 0.5 MG PO TABS
0.2500 mg | ORAL_TABLET | Freq: Once | ORAL | Status: AC
  Administered 2022-10-04: 0.25 mg via ORAL
  Filled 2022-10-04: qty 1

## 2022-10-04 NOTE — Discharge Instructions (Signed)
Please avoid exertional activities or intense exercise until you have had follow-up with her cardiologist for further recommendation.
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
## 2022-10-04 NOTE — ED Triage Notes (Addendum)
Pt is A&Ox4 on arrival. Pt denies LOC, pt states she felt weak and dizzy. Pt states she did eat prior to the gym.

## 2022-10-04 NOTE — ED Triage Notes (Signed)
First nurse note: Pt was at the gym and started not feeling good. Pt was found in the locker room on a bench and was confused.   EMS VS:  CBG 129 168/100 NSR

## 2022-10-11 ENCOUNTER — Other Ambulatory Visit: Payer: Self-pay | Admitting: Internal Medicine

## 2022-10-11 DIAGNOSIS — R55 Syncope and collapse: Secondary | ICD-10-CM

## 2022-10-12 ENCOUNTER — Inpatient Hospital Stay: Admission: RE | Admit: 2022-10-12 | Payer: 59 | Source: Ambulatory Visit

## 2022-10-20 ENCOUNTER — Other Ambulatory Visit: Payer: 59

## 2022-11-22 ENCOUNTER — Other Ambulatory Visit: Payer: Self-pay | Admitting: Neurology

## 2022-11-22 DIAGNOSIS — R55 Syncope and collapse: Secondary | ICD-10-CM

## 2022-11-23 ENCOUNTER — Other Ambulatory Visit: Payer: Self-pay | Admitting: Neurology

## 2022-11-23 DIAGNOSIS — R55 Syncope and collapse: Secondary | ICD-10-CM

## 2022-11-29 ENCOUNTER — Encounter: Payer: Self-pay | Admitting: Neurology

## 2022-11-30 ENCOUNTER — Ambulatory Visit
Admission: RE | Admit: 2022-11-30 | Discharge: 2022-11-30 | Disposition: A | Discharge status: Home / Self Care | Payer: 59 | Source: Ambulatory Visit | Attending: Neurology | Admitting: Neurology

## 2022-11-30 DIAGNOSIS — R55 Syncope and collapse: Secondary | ICD-10-CM

## 2022-11-30 MED ORDER — GADOPICLENOL 0.5 MMOL/ML IV SOLN
6.0000 mL | Freq: Once | INTRAVENOUS | Status: AC | PRN
  Administered 2022-11-30: 6 mL via INTRAVENOUS

## 2022-12-07 ENCOUNTER — Other Ambulatory Visit: Payer: 59

## 2023-04-10 ENCOUNTER — Other Ambulatory Visit: Payer: Self-pay | Admitting: Physician Assistant

## 2023-04-10 DIAGNOSIS — K219 Gastro-esophageal reflux disease without esophagitis: Secondary | ICD-10-CM

## 2023-05-12 ENCOUNTER — Other Ambulatory Visit: Payer: Self-pay | Admitting: Family Medicine

## 2023-05-12 DIAGNOSIS — Z1231 Encounter for screening mammogram for malignant neoplasm of breast: Secondary | ICD-10-CM

## 2023-05-17 ENCOUNTER — Other Ambulatory Visit: Payer: Self-pay | Admitting: Physician Assistant

## 2023-05-17 DIAGNOSIS — K219 Gastro-esophageal reflux disease without esophagitis: Secondary | ICD-10-CM

## 2023-05-26 ENCOUNTER — Ambulatory Visit
Admission: RE | Admit: 2023-05-26 | Discharge: 2023-05-26 | Disposition: A | Discharge status: Home / Self Care | Payer: 59 | Source: Ambulatory Visit | Attending: Family Medicine | Admitting: Family Medicine

## 2023-05-26 DIAGNOSIS — Z1231 Encounter for screening mammogram for malignant neoplasm of breast: Secondary | ICD-10-CM | POA: Insufficient documentation

## 2023-06-07 ENCOUNTER — Encounter: Payer: Self-pay | Admitting: Gastroenterology

## 2023-06-24 ENCOUNTER — Encounter: Payer: Self-pay | Admitting: Gastroenterology

## 2023-06-27 ENCOUNTER — Encounter: Payer: Self-pay | Admitting: Gastroenterology

## 2023-06-27 ENCOUNTER — Ambulatory Visit: Payer: 59 | Admitting: Certified Registered"

## 2023-06-27 ENCOUNTER — Ambulatory Visit
Admission: RE | Admit: 2023-06-27 | Discharge: 2023-06-27 | Disposition: A | Discharge status: Home / Self Care | Payer: 59 | Attending: Gastroenterology | Admitting: Gastroenterology

## 2023-06-27 ENCOUNTER — Other Ambulatory Visit: Payer: Self-pay

## 2023-06-27 ENCOUNTER — Encounter
Admission: RE | Disposition: A | Discharge status: Home / Self Care | Payer: Self-pay | Source: Home / Self Care | Attending: Gastroenterology

## 2023-06-27 DIAGNOSIS — Z980 Intestinal bypass and anastomosis status: Secondary | ICD-10-CM | POA: Insufficient documentation

## 2023-06-27 DIAGNOSIS — D124 Benign neoplasm of descending colon: Secondary | ICD-10-CM | POA: Diagnosis not present

## 2023-06-27 DIAGNOSIS — K635 Polyp of colon: Secondary | ICD-10-CM | POA: Diagnosis not present

## 2023-06-27 DIAGNOSIS — D123 Benign neoplasm of transverse colon: Secondary | ICD-10-CM | POA: Diagnosis not present

## 2023-06-27 DIAGNOSIS — Z981 Arthrodesis status: Secondary | ICD-10-CM | POA: Diagnosis not present

## 2023-06-27 DIAGNOSIS — K641 Second degree hemorrhoids: Secondary | ICD-10-CM | POA: Diagnosis not present

## 2023-06-27 DIAGNOSIS — D125 Benign neoplasm of sigmoid colon: Secondary | ICD-10-CM | POA: Insufficient documentation

## 2023-06-27 DIAGNOSIS — D120 Benign neoplasm of cecum: Secondary | ICD-10-CM | POA: Insufficient documentation

## 2023-06-27 DIAGNOSIS — K219 Gastro-esophageal reflux disease without esophagitis: Secondary | ICD-10-CM | POA: Diagnosis not present

## 2023-06-27 DIAGNOSIS — Z1211 Encounter for screening for malignant neoplasm of colon: Secondary | ICD-10-CM | POA: Insufficient documentation

## 2023-06-27 DIAGNOSIS — K5730 Diverticulosis of large intestine without perforation or abscess without bleeding: Secondary | ICD-10-CM | POA: Diagnosis not present

## 2023-06-27 HISTORY — PX: POLYPECTOMY: SHX5525

## 2023-06-27 HISTORY — PX: COLONOSCOPY WITH PROPOFOL: SHX5780

## 2023-06-27 HISTORY — PX: HEMOSTASIS CLIP PLACEMENT: SHX6857

## 2023-06-27 SURGERY — COLONOSCOPY WITH PROPOFOL
Anesthesia: General

## 2023-06-27 MED ORDER — LIDOCAINE HCL (PF) 2 % IJ SOLN
INTRAMUSCULAR | Status: AC
  Filled 2023-06-27: qty 5

## 2023-06-27 MED ORDER — PROPOFOL 10 MG/ML IV BOLUS
INTRAVENOUS | Status: AC
  Filled 2023-06-27: qty 20

## 2023-06-27 MED ORDER — PROPOFOL 10 MG/ML IV BOLUS
INTRAVENOUS | Status: DC | PRN
  Administered 2023-06-27: 80 mg via INTRAVENOUS
  Administered 2023-06-27: 180 ug/kg/min via INTRAVENOUS

## 2023-06-27 MED ORDER — SODIUM CHLORIDE 0.9 % IV SOLN: INTRAVENOUS | Status: DC

## 2023-06-27 MED ORDER — PROPOFOL 10 MG/ML IV BOLUS
INTRAVENOUS | Status: AC
  Filled 2023-06-27: qty 40

## 2023-06-27 MED ORDER — LIDOCAINE HCL (CARDIAC) PF 100 MG/5ML IV SOSY
PREFILLED_SYRINGE | INTRAVENOUS | Status: DC | PRN
  Administered 2023-06-27: 40 mg via INTRAVENOUS

## 2023-06-27 NOTE — Op Note (Addendum)
[facility] Patient Name: Sarah Clarke Procedure Date: 06/27/2023 7:05 AM MRN: [id number] Account #: [id number] Date of Birth: 1960/07/21 Admit Type: Outpatient Age: 62 Room: [facility] ENDO ROOM 2 Gender: Female Note Status: Finalized Instrument Name: Peds Colonoscope [id number] Procedure:             Colonoscopy Indications:           High risk colon cancer surveillance: Personal history                         of colonic polyps Providers:             Trenda Moots, DO Referring MD:          Wilford Corner (Referring MD) Medicines:             Monitored Anesthesia Care Complications:         No immediate complications. Estimated blood loss:                         Minimal. Procedure:             Pre-Anesthesia Assessment:                        - Prior to the procedure, a History and Physical was                         performed, and patient medications and allergies were                         reviewed. The patient is competent. The risks and                         benefits of the procedure and the sedation options and                         risks were discussed with the patient. All questions                         were answered and informed consent was obtained.                         Patient identification and proposed procedure were                         verified by the physician, the nurse, the anesthetist                         and the technician in the endoscopy suite. Mental                         Status Examination: alert and oriented. Airway                         Examination: normal oropharyngeal airway and neck                         mobility. Respiratory Examination: clear to  auscultation. CV Examination: RRR, no murmurs, no S3                         or S4. Prophylactic Antibiotics: The patient does not                         require prophylactic antibiotics. Prior                          Anticoagulants: The patient has taken no anticoagulant                         or antiplatelet agents. ASA Grade Assessment: II - A                         patient with mild systemic disease. After reviewing                         the risks and benefits, the patient was deemed in                         satisfactory condition to undergo the procedure. The                         anesthesia plan was to use monitored anesthesia care                         (MAC). Immediately prior to administration of                         medications, the patient was re-assessed for adequacy                         to receive sedatives. The heart rate, respiratory                         rate, oxygen saturations, blood pressure, adequacy of                         pulmonary ventilation, and response to care were                         monitored throughout the procedure. The physical                         status of the patient was re-assessed after the                         procedure.                        After obtaining informed consent, the colonoscope was                         passed under direct vision. Throughout the procedure,                         the patient's blood pressure, pulse, and oxygen  saturations were monitored continuously. The                         Colonoscope was introduced through the anus and                         advanced to the the ileocolonic anastomosis. The                         colonoscopy was somewhat difficult due to significant                         looping and a tortuous colon. Successful completion of                         the procedure was aided by straightening and                         shortening the scope to obtain bowel loop reduction,                         using scope torsion and lavage. The patient tolerated                         the procedure well. The quality of the bowel                         preparation was  good. The ileocolonic anastamosis,                         rectum were photographed. Findings:      Hemorrhoids were found on perianal exam.      The digital rectal exam was normal. Pertinent negatives include normal       sphincter tone.      The neo-terminal ileum appeared normal. Estimated blood loss: none.      A 6 to 7 mm polyp was found in the sigmoid colon. The polyp was sessile.       The polyp was removed with a cold snare. Resection and retrieval were       complete. To prevent bleeding after the polypectomy, one hemostatic clip       was successfully placed (MR conditional). There was no bleeding at the       end of the procedure.      A 14 to 16 mm polyp was found in the cecum. The polyp was flat and       sessile, crossing the fold with central indentation. Area was       unsuccessfully injected with 2 mL Eleview for lesion assessment, but the       lesion could not be lifted adequately. Polypectomy was not attempted due       to did not lift with eleview at central indentation. Biopsies were taken       with a cold forceps for histology. Estimated blood loss was minimal.      A 12 to 13 mm polyp was found in the transverse colon. The polyp was       sessile. Area was successfully injected with 3 mL Eleview for lesion       assessment, and this injection appeared to  lift the lesion adequately.       Imaging was performed using white light and narrow band imaging to       visualize the mucosa. Lesion visualized under NBI for EMR. The polyp was       removed with a hot snare. Resection and retrieval were complete. To       prevent bleeding after the polypectomy, two hemostatic clips were       successfully placed (MR conditional). There was no bleeding at the end       of the procedure. Estimated blood loss was minimal. Area was tattooed       with an injection of 1 mL of [place] ink. Estimated blood loss: none.      Three sessile polyps were found in the sigmoid colon (1) and  descending       colon (2). The polyps were 1 to 2 mm in size. These polyps were removed       with a jumbo cold forceps. Resection and retrieval were complete.       Estimated blood loss was minimal.      Multiple small-mouthed diverticula were found in the entire colon.      Non-bleeding internal hemorrhoids were found during retroflexion and       during perianal exam. The hemorrhoids were Grade II (internal       hemorrhoids that prolapse but reduce spontaneously). Estimated blood       loss: none.      The exam was otherwise without abnormality on direct and retroflexion       views. Impression:            - Hemorrhoids found on perianal exam.                        - The examined portion of the ileum was normal.                        - One 6 to 7 mm polyp in the sigmoid colon, removed                         with a cold snare. Resected and retrieved. Clip (MR                         conditional) was placed.                        - One 14 to 16 mm polyp in the cecum. Resection not                         attempted. Treatment not successful. Biopsied.                        - One 12 to 13 mm polyp in the transverse colon,                         removed with a hot snare. Resected and retrieved.                         Injected. Clips (MR conditional) were placed. Tattooed.                        -  Three 1 to 2 mm polyps in the sigmoid colon and in                         the descending colon, removed with a jumbo cold                         forceps. Resected and retrieved.                        - Diverticulosis in the entire examined colon.                        - Non-bleeding internal hemorrhoids.                        - The examination was otherwise normal on direct and                         retroflexion views. Recommendation:        - Patient has a contact number available for                         emergencies. The signs and symptoms of potential                          delayed complications were discussed with the patient.                         Return to normal activities tomorrow. Written                         discharge instructions were provided to the patient.                        - Discharge patient to home.                        - Resume previous diet.                        - Continue present medications.                        - No aspirin, ibuprofen, naproxen, or other                         non-steroidal anti-inflammatory drugs for 5 days after                         polyp removal.                        - Await pathology results.                        - Refer to [facility] for anastamosis polyp                         resection at appointment to be scheduled.                        -  Return to GI clinic as previously scheduled.                        - The findings and recommendations were discussed with                         the patient. Procedure Code(s):     --- Professional ---                        [phone], Colonoscopy, flexible; with removal of                         tumor(s), polyp(s), or other lesion(s) by snare                         technique                        45380, 59, Colonoscopy, flexible; with biopsy, single                         or multiple                        45381, Colonoscopy, flexible; with directed submucosal                         injection(s), any substance Diagnosis Code(s):     --- Professional ---                        Z86.010, Personal history of colonic polyps                        K64.1, Second degree hemorrhoids                        D12.5, Benign neoplasm of sigmoid colon                        D12.0, Benign neoplasm of cecum                        D12.3, Benign neoplasm of transverse colon (hepatic                         flexure or splenic flexure)                        D12.4, Benign neoplasm of descending colon                        K57.30, Diverticulosis of large  intestine without                         perforation or abscess without bleeding CPT copyright 2022 [employer]. All rights reserved. The codes documented in this report are preliminary and upon coder review may  be revised to meet current compliance requirements. Attending Participation:      I personally performed the entire procedure. Elfredia Nevins, DO Jaynie Collins DO, DO 06/27/2023 8:39:06 AM This report has been signed electronically. Number of  Addenda: 0 Note Initiated On: 06/27/2023 7:05 AM Scope Withdrawal Time: 0 hours 34 minutes 12 seconds  Total Procedure Duration: 0 hours 44 minutes 25 seconds  Estimated Blood Loss:  Estimated blood loss was minimal.      [facility]

## 2023-06-27 NOTE — H&P (Signed)
Pre-Procedure H&P   Patient ID: Sarah Clarke is a 62 y.o. female.  Gastroenterology Provider: Jaynie Collins, DO  Referring Provider: Fransico Setters, RNNP PCP: Wilford Corner, PA-C  Date: 06/27/2023  HPI Sarah Clarke is a 62 y.o. female who presents today for Colonoscopy for Personal history of colon polyps .  The aneurysm.  Patient undergoing surveillance colonoscopy for personal history of colon polyps.  She also has a family history of polyps (mother).  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  History of C-spine fusion   Past Medical History:  Diagnosis Date   Anxiety    Arthritis     Past Surgical History:  Procedure Laterality Date   ABDOMINAL HYSTERECTOMY     Partial   BACK SURGERY     COLON SURGERY     COLONOSCOPY     COLONOSCOPY WITH PROPOFOL N/A 07/14/2017   Procedure: COLONOSCOPY WITH PROPOFOL;  Surgeon: Wyline Mood, MD;  Location: [facility] ENDOSCOPY;  Service: Gastroenterology;  Laterality: N/A;   LAPAROTOMY N/A 12/29/2014   Procedure: EXPLORATORY LAPAROTOMY with right hemicolectomy ;  Surgeon: Ida Rogue, MD;  Location: [facility] ORS;  Service: General;  Laterality: N/A;   neck fusion      Family History Fhx- polyps- mother No h/o GI disease or malignancy  Review of Systems  Constitutional:  Negative for activity change, appetite change, chills, diaphoresis, fatigue, fever and unexpected weight change.  HENT:  Negative for trouble swallowing and voice change.   Respiratory:  Negative for shortness of breath and wheezing.   Cardiovascular:  Negative for chest pain, palpitations and leg swelling.  Gastrointestinal:  Negative for abdominal distention, abdominal pain, anal bleeding, blood in stool, constipation, diarrhea, nausea, rectal pain and vomiting.   Musculoskeletal:  Negative for arthralgias and myalgias.  Skin:  Negative for color change and pallor.  Neurological:  Negative for dizziness, syncope and weakness.  Psychiatric/Behavioral:  Negative for confusion.   All other systems reviewed and are negative.    Medications No current facility-administered medications on file prior to encounter.   Current Outpatient Medications on File Prior to Encounter  Medication Sig Dispense Refill   ALPRAZolam (XANAX) 0.25 MG tablet TAKE 1 TABLET BY MOUTH AT BEDTIME AS NEEDED FOR INSOMNIA 30 tablet 2   cetirizine (ZYRTEC) 10 MG tablet Take 1 tablet (10 mg total) by mouth daily. 30 tablet 11   conjugated estrogens (PREMARIN) vaginal cream Place 1 Applicatorful vaginally once a week. 42.5 g 2   fluconazole (DIFLUCAN) 150 MG tablet Take 1 tablet by mouth once and repeat in 3 days if symptoms persist 3 tablet 0   ibuprofen (ADVIL,MOTRIN) 600 MG tablet Take 600 mg by mouth as needed.      methocarbamol (ROBAXIN) 500 MG tablet Take 1 tablet (500 mg total) by mouth every 8 (eight) hours as needed for muscle spasms. 30 tablet 1   ondansetron (ZOFRAN) 4 MG tablet TAKE 1 TABLET BY MOUTH EVERY 8 HOURS AS NEEDED FOR NAUSEA AND VOMITING 20 tablet 1   pantoprazole (PROTONIX) 40 MG tablet TAKE 1 TABLET BY MOUTH EVERY DAY 90 tablet 1    Pertinent medications related to GI and procedure were reviewed by me with the patient prior to the procedure   Current Facility-Administered Medications:    0.9 %  sodium chloride infusion, , Intravenous, Continuous, Jaynie Collins, DO  sodium chloride         Allergies  Allergen Reactions   Codeine Itching   Morphine And Codeine Other (See Comments)    hallucinations   Allergies were reviewed by me prior to the procedure  Objective   Body mass index is 20.18 kg/m. Vitals:   06/27/23 0703  BP: (!) 155/100  Pulse: 84  Resp: 16  Temp: (!) 96.6 F (35.9 C)  TempSrc: Temporal  SpO2: 100%  Weight: 56.7  kg  Height: 5\' 6"  (1.676 m)     Physical Exam Vitals and nursing note reviewed.  Constitutional:      General: She is not in acute distress.    Appearance: Normal appearance. She is not ill-appearing, toxic-appearing or diaphoretic.  HENT:     Head: Normocephalic and atraumatic.     Nose: Nose normal.     Mouth/Throat:     Mouth: Mucous membranes are moist.     Pharynx: Oropharynx is clear.  Eyes:     General: No scleral icterus.    Extraocular Movements: Extraocular movements intact.  Cardiovascular:     Rate and Rhythm: Normal rate and regular rhythm.     Heart sounds: Normal heart sounds. No murmur heard.    No friction rub. No gallop.  Pulmonary:     Effort: Pulmonary effort is normal. No respiratory distress.     Breath sounds: Normal breath sounds. No wheezing, rhonchi or rales.  Abdominal:     General: Bowel sounds are normal. There is no distension.     Palpations: Abdomen is soft.     Tenderness: There is no abdominal tenderness. There is no guarding or rebound.  Musculoskeletal:     Cervical back: Neck supple.     Right lower leg: No edema.     Left lower leg: No edema.  Skin:    General: Skin is warm and dry.     Coloration: Skin is not jaundiced or pale.  Neurological:     General: No focal deficit present.     Mental Status: She is alert and oriented to person, place, and time. Mental status is at baseline.  Psychiatric:        Mood and Affect: Mood normal.        Behavior: Behavior normal.        Thought Content: Thought content normal.        Judgment: Judgment normal.      Assessment:  Sarah Clarke is a 62 y.o. female  who presents today for Colonoscopy for Personal history of colon polyps .  Plan:  Colonoscopy with possible intervention today  Colonoscopy with possible biopsy, control of bleeding, polypectomy, and interventions as necessary has been discussed with the patient/patient representative. Informed consent was obtained from the  patient/patient representative after explaining the indication, nature, and risks of the procedure including but not limited to death, bleeding, perforation, missed neoplasm/lesions, cardiorespiratory compromise, and reaction to medications. Opportunity for questions was given and appropriate answers were provided. Patient/patient representative has verbalized understanding is amenable to undergoing the procedure.   Jaynie Collins, DO  [facility]  Portions of the record may have been created with voice recognition software. Occasional wrong-word or 'sound-a-like' substitutions may have occurred due to the inherent limitations of voice recognition software.  Read the chart carefully and recognize, using context, where substitutions may have occurred.

## 2023-06-27 NOTE — Anesthesia Preprocedure Evaluation (Addendum)
Anesthesia Evaluation  Patient identified by MRN, date of birth, ID band Patient awake    Reviewed: Allergy & Precautions, NPO status , Patient's Chart, lab work & pertinent test results  History of Anesthesia Complications Negative for: history of anesthetic complications  Airway Mallampati: II  TM Distance: >3 FB Neck ROM: full    Dental no notable dental hx.    Pulmonary neg pulmonary ROS   Pulmonary exam normal        Cardiovascular negative cardio ROS Normal cardiovascular exam     Neuro/Psych  PSYCHIATRIC DISORDERS Anxiety     negative neurological ROS     GI/Hepatic Neg liver ROS,GERD  ,,  Endo/Other  negative endocrine ROS    Renal/GU negative Renal ROS  negative genitourinary   Musculoskeletal  (+) Arthritis ,    Abdominal   Peds  Hematology negative hematology ROS (+)   Anesthesia Other Findings Past Medical History: No date: Anxiety No date: Arthritis  Past Surgical History: No date: ABDOMINAL HYSTERECTOMY     Comment:  Partial No date: BACK SURGERY No date: COLON SURGERY No date: COLONOSCOPY 07/14/2017: COLONOSCOPY WITH PROPOFOL; N/A     Comment:  Procedure: COLONOSCOPY WITH PROPOFOL;  Surgeon: Wyline Mood, MD;  Location: [facility] ENDOSCOPY;  Service:               Gastroenterology;  Laterality: N/A; 12/29/2014: LAPAROTOMY; N/A     Comment:  Procedure: EXPLORATORY LAPAROTOMY with right               hemicolectomy ;  Surgeon: Ida Rogue, MD;                Location: [facility] ORS;  Service: General;  Laterality: N/A; No date: neck fusion  BMI    Body Mass Index: 20.18 kg/m      Reproductive/Obstetrics negative OB ROS                             Anesthesia Physical Anesthesia Plan  ASA: 2  Anesthesia Plan: General   Post-op Pain Management: Minimal or no pain anticipated   Induction: Intravenous  PONV Risk Score and Plan: 2 and Propofol  infusion and TIVA  Airway Management Planned: Natural Airway and Nasal Cannula  Additional Equipment:   Intra-op Plan:   Post-operative Plan:   Informed Consent: I have reviewed the patients History and Physical, chart, labs and discussed the procedure including the risks, benefits and alternatives for the proposed anesthesia with the patient or authorized representative who has indicated his/her understanding and acceptance.     Dental Advisory Given  Plan Discussed with: Anesthesiologist, CRNA and Surgeon  Anesthesia Plan Comments: (Patient consented for risks of anesthesia including but not limited to:  - adverse reactions to medications - risk of airway placement if required - damage to eyes, teeth, lips or other oral mucosa - nerve damage due to positioning  - sore throat or hoarseness - Damage to heart, brain, nerves, lungs, other parts of body or loss of life  Patient voiced understanding and assent.)       Anesthesia Quick Evaluation

## 2023-06-27 NOTE — Anesthesia Postprocedure Evaluation (Signed)
Anesthesia Post Note  Patient: Sarah Clarke  Procedure(s) Performed: COLONOSCOPY WITH PROPOFOL POLYPECTOMY HEMOSTASIS CLIP PLACEMENT INTRADERMAL TATTOOING  Patient location during evaluation: Endoscopy Anesthesia Type: General Level of consciousness: awake and alert Pain management: pain level controlled Vital Signs Assessment: post-procedure vital signs reviewed and stable Respiratory status: spontaneous breathing, nonlabored ventilation, respiratory function stable and patient connected to nasal cannula oxygen Cardiovascular status: blood pressure returned to baseline and stable Postop Assessment: no apparent nausea or vomiting Anesthetic complications: no   No notable events documented.   Last Vitals:  Vitals:   06/27/23 0841 06/27/23 0851  BP: (!) 140/107 (!) 134/94  Pulse: 70 70  Resp: 18 15  Temp:    SpO2: 100% 98%    Last Pain:  Vitals:   06/27/23 0851  TempSrc:   PainSc: 0-No pain                 Louie Boston

## 2023-06-27 NOTE — Interval H&P Note (Signed)
History and Physical Interval Note: Preprocedure H&P from 06/27/23  was reviewed and there was no interval change after seeing and examining the patient.  Written consent was obtained from the patient after discussion of risks, benefits, and alternatives. Patient has consented to proceed with Colonoscopy with possible intervention   06/27/2023 7:25 AM  Sarah Clarke  has presented today for surgery, with the diagnosis of V12.72 (ICD-9-CM) - Z86.010 (ICD-10-CM) - H/O adenomatous polyp of colon.  The various methods of treatment have been discussed with the patient and family. After consideration of risks, benefits and other options for treatment, the patient has consented to  Procedure(s): COLONOSCOPY WITH PROPOFOL (N/A) as a surgical intervention.  The patient's history has been reviewed, patient examined, no change in status, stable for surgery.  I have reviewed the patient's chart and labs.  Questions were answered to the patient's satisfaction.     Jaynie Collins

## 2023-06-27 NOTE — Transfer of Care (Signed)
Immediate Anesthesia Transfer of Care Note  Patient: Sarah Clarke  Procedure(s) Performed: COLONOSCOPY WITH PROPOFOL POLYPECTOMY HEMOSTASIS CLIP PLACEMENT INTRADERMAL TATTOOING  Patient Location: Endoscopy Unit  Anesthesia Type:General  Level of Consciousness: drowsy  Airway & Oxygen Therapy: Patient Spontanous Breathing  Post-op Assessment: Report given to RN and Post -op Vital signs reviewed and stable  Post vital signs: Reviewed and stable  Last Vitals:  Vitals Value Taken Time  BP 105/67 06/27/23 0831  Temp 35.9 C 06/27/23 0831  Pulse 66 06/27/23 0832  Resp 11 06/27/23 0832  SpO2 100 % 06/27/23 0832  Vitals shown include unfiled device data.  Last Pain:  Vitals:   06/27/23 0831  TempSrc: Tympanic  PainSc: Asleep         Complications: No notable events documented.

## 2023-06-28 ENCOUNTER — Encounter: Payer: Self-pay | Admitting: Gastroenterology

## 2023-06-28 LAB — SURGICAL PATHOLOGY

## 2023-12-29 ENCOUNTER — Other Ambulatory Visit: Payer: Self-pay

## 2023-12-29 DIAGNOSIS — R55 Syncope and collapse: Secondary | ICD-10-CM

## 2023-12-29 DIAGNOSIS — R569 Unspecified convulsions: Secondary | ICD-10-CM

## 2024-01-06 ENCOUNTER — Ambulatory Visit
Admission: RE | Admit: 2024-01-06 | Discharge: 2024-01-06 | Disposition: A | Discharge status: Home / Self Care | Source: Ambulatory Visit

## 2024-01-06 DIAGNOSIS — R569 Unspecified convulsions: Secondary | ICD-10-CM

## 2024-01-06 DIAGNOSIS — R55 Syncope and collapse: Secondary | ICD-10-CM

## 2024-01-06 MED ORDER — GADOPICLENOL 0.5 MMOL/ML IV SOLN
7.5000 mL | Freq: Once | INTRAVENOUS | Status: AC | PRN
  Administered 2024-01-06: 7.5 mL via INTRAVENOUS

## 2024-01-18 ENCOUNTER — Other Ambulatory Visit: Payer: Self-pay

## 2024-01-18 DIAGNOSIS — R55 Syncope and collapse: Secondary | ICD-10-CM

## 2024-01-24 ENCOUNTER — Ambulatory Visit
Admission: RE | Admit: 2024-01-24 | Discharge: 2024-01-24 | Disposition: A | Discharge status: Home / Self Care | Source: Ambulatory Visit

## 2024-01-24 DIAGNOSIS — R55 Syncope and collapse: Secondary | ICD-10-CM

## 2024-02-27 ENCOUNTER — Emergency Department

## 2024-02-27 ENCOUNTER — Other Ambulatory Visit: Payer: Self-pay

## 2024-02-27 ENCOUNTER — Emergency Department
Admission: EM | Admit: 2024-02-27 | Discharge: 2024-02-27 | Disposition: A | Discharge status: Home / Self Care | Source: Ambulatory Visit | Attending: Emergency Medicine | Admitting: Emergency Medicine

## 2024-02-27 DIAGNOSIS — E871 Hypo-osmolality and hyponatremia: Secondary | ICD-10-CM | POA: Diagnosis not present

## 2024-02-27 DIAGNOSIS — R55 Syncope and collapse: Secondary | ICD-10-CM

## 2024-02-27 LAB — COMPREHENSIVE METABOLIC PANEL WITH GFR
ALT: 14 U/L (ref 0–44)
AST: 24 U/L (ref 15–41)
Albumin: 3.9 g/dL (ref 3.5–5.0)
Alkaline Phosphatase: 49 U/L (ref 38–126)
Anion gap: 8 (ref 5–15)
BUN: 10 mg/dL (ref 8–23)
CO2: 28 mmol/L (ref 22–32)
Calcium: 9.5 mg/dL (ref 8.9–10.3)
Chloride: 96 mmol/L — ABNORMAL LOW (ref 98–111)
Creatinine, Ser: 0.75 mg/dL (ref 0.44–1.00)
GFR, Estimated: >60 mL/min (ref >60)
Glucose, Bld: 134 mg/dL — ABNORMAL HIGH (ref 70–99)
Potassium: 3.9 mmol/L (ref 3.5–5.1)
Sodium: 132 mmol/L — ABNORMAL LOW (ref 135–145)
Total Bilirubin: 1.1 mg/dL (ref 0.0–1.2)
Total Protein: 6.7 g/dL (ref 6.5–8.1)

## 2024-02-27 LAB — CBC
HCT: 36.8 % (ref 36.0–46.0)
Hemoglobin: 12.6 g/dL (ref 12.0–15.0)
MCH: 32.4 pg (ref 26.0–34.0)
MCHC: 34.2 g/dL (ref 30.0–36.0)
MCV: 94.6 fL (ref 80.0–100.0)
Platelets: 305 K/uL (ref 150–400)
RBC: 3.89 MIL/uL (ref 3.87–5.11)
RDW: 12.3 % (ref 11.5–15.5)
WBC: 5 K/uL (ref 4.0–10.5)
nRBC: 0 % (ref 0.0–0.2)

## 2024-02-27 LAB — TROPONIN I (HIGH SENSITIVITY)
Troponin I (High Sensitivity): 3 ng/L (ref <18)
Troponin I (High Sensitivity): 3 ng/L (ref <18)

## 2024-02-27 LAB — D-DIMER, QUANTITATIVE: D-Dimer, Quant: 0.37 ug{FEU}/mL (ref 0.00–0.50)

## 2024-02-27 MED ORDER — SODIUM CHLORIDE 0.9 % IV BOLUS
1000.0000 mL | Freq: Once | INTRAVENOUS | Status: AC
  Administered 2024-02-27 (×2): 1000 mL via INTRAVENOUS

## 2024-02-27 MED ORDER — ACETAMINOPHEN 325 MG PO TABS
650.0000 mg | ORAL_TABLET | Freq: Once | ORAL | Status: AC
  Administered 2024-02-27 (×2): 650 mg via ORAL
  Filled 2024-02-27: qty 2

## 2024-02-27 NOTE — ED Notes (Signed)
 Pt given DC instructions. Pt verbalized understanding of follow up care. Pt ambulatory from ED without difficulty. NAD noted at departure.

## 2024-02-27 NOTE — ED Triage Notes (Addendum)
 Pt comes with c/o headache and had passing out episode at [facility]. Pt was there for stress test. Pt had IV placed and had dye placed thru it while having stress test. Pt then went out to parking lot and ate something. Pt then came back inside and that is when she passed out. Pt states she was fine earlier today. Pt denies any thinners.

## 2024-02-27 NOTE — Discharge Instructions (Signed)
 Your labs and imaging were reassuring today. Please follow up with your primary care provider, cardiologist and neurologist as needed. Return to the ED with any worsening symptoms.

## 2024-02-27 NOTE — ED Triage Notes (Signed)
 At [facility] during stress test. Had gone to get some lunch, had a syncopal episode in [place]. Continue to c/o lightheadedness, SOB.  BP after syncope 80/50 Patient arrives AAOx3.  Skin warm and dry. NAD

## 2024-02-27 NOTE — ED Notes (Signed)
 Lab called to add on trop test

## 2024-02-27 NOTE — ED Provider Notes (Signed)
 [facility] Provider Note    Event Date/Time   First MD Initiated Contact with Patient 02/27/24 1609     (approximate)   History   Near Syncope   HPI  LARINDA HERTER is a 63 y.o. female with PMH of syncope who presents for evaluation of syncopal episode. Patient was at the [facility] this morning for a stress test. She had one done earlier in the morning and was then instructed to get something to eat as needed to be NPO for the testing. She returned to the office after getting breakfast and when she was walking into the office she began to feel light headed so she went to the bathroom. Once in the bathroom she felt very weak and clammy, she lowered herself to the floor and then passed out. She estimates she was unconscious for 20 minutes. Eventually she woke back up and was evaluated by the team there and directed to come to the ED. She states she has had syncopal episodes over the last year and her doctor has been unable to determine the cause. This is why she was getting a stress test. She states the episode today was the same as her previous episodes. She does not think she hit her head.       Physical Exam   Triage Vital Signs: ED Triage Vitals  Encounter Vitals Group     BP 02/27/24 1158 111/77     Girls Systolic BP Percentile --      Girls Diastolic BP Percentile --      Boys Systolic BP Percentile --      Boys Diastolic BP Percentile --      Pulse Rate 02/27/24 1158 87     Resp 02/27/24 1158 18     Temp 02/27/24 1158 98 F (36.7 C)     Temp src --      SpO2 02/27/24 1158 100 %     Weight 02/27/24 1156 127 lb (57.6 kg)     Height 02/27/24 1156 5' 6.5 (1.689 m)     Head Circumference --      Peak Flow --      Pain Score 02/27/24 1156 8     Pain Loc --      Pain Education --      Exclude from Growth Chart --     Most recent vital signs: Vitals:   02/27/24 1158 02/27/24 2019  BP: 111/77 137/75  Pulse: 87 93  Resp: 18 17  Temp: 98 F (36.7  C) 97.7 F (36.5 C)  SpO2: 100% 100%   General: Awake, no distress.  CV:  Good peripheral perfusion. RRR. Resp:  Normal effort. CTAB. Abd:  No distention.  Other:  Radial and dorsalis pedis pulses are equal bilaterally.   ED Results / Procedures / Treatments   Labs (all labs ordered are listed, but only abnormal results are displayed) Labs Reviewed  COMPREHENSIVE METABOLIC PANEL WITH GFR - Abnormal; Notable for the following components:      Result Value   Sodium 132 (*)    Chloride 96 (*)    Glucose, Bld 134 (*)    All other components within normal limits  CBC  D-DIMER, QUANTITATIVE  URINALYSIS, ROUTINE W REFLEX MICROSCOPIC  CBG MONITORING, ED  TROPONIN I (HIGH SENSITIVITY)  TROPONIN I (HIGH SENSITIVITY)     EKG  ED provider interpretation: Normal sinus rhythm.  Vent. rate 68 BPM PR interval 176 ms QRS duration 78 ms QT/QTcB  392/416 ms P-R-T axes * 64 55  RADIOLOGY  CT head and cervical spine obtained, I interpreted the images as well as reviewed the radiologist report which was negative for any acute abnormalities.  PROCEDURES:  Critical Care performed: No  Procedures   MEDICATIONS ORDERED IN ED: Medications  sodium chloride  0.9 % bolus 1,000 mL (0 mLs Intravenous Stopped 02/27/24 1930)  acetaminophen  (TYLENOL ) tablet 650 mg (650 mg Oral Given 02/27/24 1724)     IMPRESSION / MDM / ASSESSMENT AND PLAN / ED COURSE  I reviewed the triage vital signs and the nursing notes.                             63 year old female presents for evaluation of syncopal episode.  Vital signs are stable patient NAD on exam.  Differential diagnosis includes, but is not limited to, cardiac arrhythmia, electrolyte abnormality, dehydration, hypoglycemia, seizure, medication side effect.  Patient's presentation is most consistent with acute complicated illness / injury requiring diagnostic workup.  CMP shows mild hyponatremia otherwise unremarkable.  CBC unremarkable.   First troponin is not elevated.  Physical exam is overall reassuring.  Patient reports feeling weak and having a mild headache.  Will give her some IV fluids and Tylenol  and reassess.   Clinical Course as of 02/27/24 2033  Mon Feb 27, 2024  1814 Troponin I (High Sensitivity) Second troponin is not elevated. [LD]  1911 Although I do not feel patient history fits the clinical picture for a PE, this is something I considered. I feel she is low risk so will obtain a d dimer to rule this out.  [LD]  2020 D-dimer, quantitative Negative, PE ruled out as cause. [LD]  2030 Patient reports she is feeling better.  Work up is reassuring, do not see indication for further emergent work up at this time. Patient is safe for discharge. Unsure of what caused her syncopal episode today, advised her to follow up with her PCP, neurologist and cardiologist. She was given return precautions, she voiced understanding, all questions were answered she is stable at discharge. [LD]    Clinical Course User Index [LD] Cleaster Tinnie LABOR, PA-C     FINAL CLINICAL IMPRESSION(S) / ED DIAGNOSES   Final diagnoses:  Syncope, unspecified syncope type     Rx / DC Orders   ED Discharge Orders     None        Note:  This document was prepared using Dragon voice recognition software and may include unintentional dictation errors.   Cleaster Tinnie LABOR, PA-C 02/27/24 2034    Dorothyann Drivers, MD 02/27/24 2251

## 2024-05-10 ENCOUNTER — Other Ambulatory Visit: Payer: Self-pay | Admitting: Family Medicine

## 2024-05-10 DIAGNOSIS — Z1231 Encounter for screening mammogram for malignant neoplasm of breast: Secondary | ICD-10-CM

## 2024-06-05 ENCOUNTER — Ambulatory Visit
Admission: RE | Admit: 2024-06-05 | Discharge: 2024-06-05 | Disposition: A | Discharge status: Home / Self Care | Source: Ambulatory Visit | Attending: Family Medicine | Admitting: Family Medicine

## 2024-06-05 DIAGNOSIS — Z1231 Encounter for screening mammogram for malignant neoplasm of breast: Secondary | ICD-10-CM | POA: Diagnosis present
